# Patient Record
Sex: Male | Born: 1970 | Race: Black or African American | Hispanic: No | Smoking: Former smoker
Health system: Southern US, Community
[De-identification: ages and names within clinical notes are randomized; demographics above are authoritative.]

## PROBLEM LIST (undated history)

## (undated) DIAGNOSIS — J40 Bronchitis, not specified as acute or chronic: Secondary | ICD-10-CM

## (undated) DIAGNOSIS — M2040 Other hammer toe(s) (acquired), unspecified foot: Secondary | ICD-10-CM

## (undated) DIAGNOSIS — J309 Allergic rhinitis, unspecified: Secondary | ICD-10-CM

## (undated) DIAGNOSIS — N529 Male erectile dysfunction, unspecified: Secondary | ICD-10-CM

## (undated) DIAGNOSIS — T8859XA Other complications of anesthesia, initial encounter: Secondary | ICD-10-CM

## (undated) DIAGNOSIS — M5136 Other intervertebral disc degeneration, lumbar region: Secondary | ICD-10-CM

## (undated) DIAGNOSIS — F319 Bipolar disorder, unspecified: Secondary | ICD-10-CM

## (undated) DIAGNOSIS — M51369 Other intervertebral disc degeneration, lumbar region without mention of lumbar back pain or lower extremity pain: Secondary | ICD-10-CM

## (undated) DIAGNOSIS — S129XXA Fracture of neck, unspecified, initial encounter: Secondary | ICD-10-CM

## (undated) DIAGNOSIS — E559 Vitamin D deficiency, unspecified: Secondary | ICD-10-CM

## (undated) DIAGNOSIS — F1210 Cannabis abuse, uncomplicated: Secondary | ICD-10-CM

## (undated) DIAGNOSIS — T4145XA Adverse effect of unspecified anesthetic, initial encounter: Secondary | ICD-10-CM

## (undated) HISTORY — DX: Other intervertebral disc degeneration, lumbar region: M51.36

## (undated) HISTORY — PX: TOE SURGERY: SHX1073

## (undated) HISTORY — PX: OTHER SURGICAL HISTORY: SHX169

## (undated) HISTORY — DX: Other hammer toe(s) (acquired), unspecified foot: M20.40

## (undated) HISTORY — PX: NECK SURGERY: SHX720

## (undated) HISTORY — DX: Male erectile dysfunction, unspecified: N52.9

## (undated) HISTORY — DX: Allergic rhinitis, unspecified: J30.9

## (undated) HISTORY — PX: HERNIA REPAIR: SHX51

## (undated) HISTORY — DX: Bronchitis, not specified as acute or chronic: J40

## (undated) HISTORY — DX: Other intervertebral disc degeneration, lumbar region without mention of lumbar back pain or lower extremity pain: M51.369

## (undated) HISTORY — DX: Vitamin D deficiency, unspecified: E55.9

---

## 2011-07-10 ENCOUNTER — Emergency Department (HOSPITAL_COMMUNITY)
Admission: EM | Admit: 2011-07-10 | Discharge: 2011-07-10 | Disposition: A | Discharge status: Home / Self Care | Payer: Self-pay | Attending: Emergency Medicine | Admitting: Emergency Medicine

## 2011-07-10 DIAGNOSIS — K2990 Gastroduodenitis, unspecified, without bleeding: Secondary | ICD-10-CM | POA: Insufficient documentation

## 2011-07-10 DIAGNOSIS — R630 Anorexia: Secondary | ICD-10-CM | POA: Insufficient documentation

## 2011-07-10 DIAGNOSIS — R51 Headache: Secondary | ICD-10-CM | POA: Insufficient documentation

## 2011-07-10 DIAGNOSIS — R1033 Periumbilical pain: Secondary | ICD-10-CM | POA: Insufficient documentation

## 2011-07-10 DIAGNOSIS — R110 Nausea: Secondary | ICD-10-CM | POA: Insufficient documentation

## 2011-07-10 DIAGNOSIS — K2970 Gastritis, unspecified, without bleeding: Secondary | ICD-10-CM | POA: Insufficient documentation

## 2011-07-10 DIAGNOSIS — Z9889 Other specified postprocedural states: Secondary | ICD-10-CM | POA: Insufficient documentation

## 2011-07-10 LAB — DIFFERENTIAL
Lymphocytes Relative: 46 % (ref 12–46)
Lymphs Abs: 2.1 10*3/uL (ref 0.7–4.0)
Monocytes Absolute: 0.4 10*3/uL (ref 0.1–1.0)
Monocytes Relative: 9 % (ref 3–12)
Neutro Abs: 1.9 10*3/uL (ref 1.7–7.7)

## 2011-07-10 LAB — COMPREHENSIVE METABOLIC PANEL
Alkaline Phosphatase: 63 U/L (ref 39–117)
BUN: 9 mg/dL (ref 6–23)
CO2: 24 mEq/L (ref 19–32)
Calcium: 9.3 mg/dL (ref 8.4–10.5)
GFR calc Af Amer: >60 mL/min (ref >60)
GFR calc non Af Amer: >60 mL/min (ref >60)
Glucose, Bld: 78 mg/dL (ref 70–99)
Total Protein: 6.8 g/dL (ref 6.0–8.3)

## 2011-07-10 LAB — LIPASE, BLOOD: Lipase: 38 U/L (ref 11–59)

## 2011-07-10 LAB — CBC
HCT: 41.9 % (ref 39.0–52.0)
Hemoglobin: 14.8 g/dL (ref 13.0–17.0)
MCH: 33.3 pg (ref 26.0–34.0)
MCHC: 35.3 g/dL (ref 30.0–36.0)
RBC: 4.45 MIL/uL (ref 4.22–5.81)

## 2012-09-22 ENCOUNTER — Emergency Department (HOSPITAL_COMMUNITY): Payer: BC Managed Care – PPO

## 2012-09-22 ENCOUNTER — Emergency Department (HOSPITAL_COMMUNITY)
Admission: EM | Admit: 2012-09-22 | Discharge: 2012-09-22 | Disposition: A | Discharge status: Home / Self Care | Payer: BC Managed Care – PPO | Attending: Emergency Medicine | Admitting: Emergency Medicine

## 2012-09-22 ENCOUNTER — Encounter (HOSPITAL_COMMUNITY): Payer: Self-pay | Admitting: *Deleted

## 2012-09-22 DIAGNOSIS — Z91012 Allergy to eggs: Secondary | ICD-10-CM | POA: Insufficient documentation

## 2012-09-22 DIAGNOSIS — M542 Cervicalgia: Secondary | ICD-10-CM | POA: Insufficient documentation

## 2012-09-22 MED ORDER — DIAZEPAM 5 MG PO TABS
5.0000 mg | ORAL_TABLET | Freq: Once | ORAL | Status: AC
  Administered 2012-09-22: 5 mg via ORAL
  Filled 2012-09-22: qty 1

## 2012-09-22 MED ORDER — IBUPROFEN 800 MG PO TABS: 800.0000 mg | ORAL_TABLET | Freq: Three times a day (TID) | ORAL | Status: DC

## 2012-09-22 MED ORDER — CYCLOBENZAPRINE HCL 10 MG PO TABS: 10.0000 mg | ORAL_TABLET | Freq: Two times a day (BID) | ORAL | Status: DC | PRN

## 2012-09-22 MED ORDER — OXYCODONE-ACETAMINOPHEN 5-325 MG PO TABS: 1.0000 | ORAL_TABLET | ORAL | Status: DC | PRN

## 2012-09-22 MED ORDER — OXYCODONE-ACETAMINOPHEN 5-325 MG PO TABS
2.0000 | ORAL_TABLET | Freq: Once | ORAL | Status: AC
  Administered 2012-09-22: 2 via ORAL
  Filled 2012-09-22: qty 2

## 2012-09-22 MED ORDER — IBUPROFEN 800 MG PO TABS
800.0000 mg | ORAL_TABLET | Freq: Once | ORAL | Status: AC
  Administered 2012-09-22: 800 mg via ORAL
  Filled 2012-09-22: qty 1
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
## 2013-04-14 ENCOUNTER — Encounter (HOSPITAL_COMMUNITY): Payer: Self-pay | Admitting: *Deleted

## 2013-04-14 ENCOUNTER — Emergency Department (HOSPITAL_COMMUNITY)
Admission: EM | Admit: 2013-04-14 | Discharge: 2013-04-14 | Disposition: A | Discharge status: Home / Self Care | Payer: Medicaid Other | Attending: Emergency Medicine | Admitting: Emergency Medicine

## 2013-04-14 DIAGNOSIS — M62838 Other muscle spasm: Secondary | ICD-10-CM | POA: Insufficient documentation

## 2013-04-14 MED ORDER — IBUPROFEN 400 MG PO TABS
800.0000 mg | ORAL_TABLET | Freq: Once | ORAL | Status: AC
  Administered 2013-04-14: 800 mg via ORAL
  Filled 2013-04-14: qty 2

## 2013-04-14 MED ORDER — IBUPROFEN 800 MG PO TABS: 800.0000 mg | ORAL_TABLET | Freq: Once | ORAL | Status: DC

## 2013-04-14 MED ORDER — DIAZEPAM 5 MG PO TABS: 5.0000 mg | ORAL_TABLET | Freq: Two times a day (BID) | ORAL | Status: DC | PRN

## 2013-04-14 MED ORDER — DIAZEPAM 5 MG PO TABS
5.0000 mg | ORAL_TABLET | Freq: Once | ORAL | Status: AC
  Administered 2013-04-14: 5 mg via ORAL
  Filled 2013-04-14: qty 1

## 2013-04-14 NOTE — ED Provider Notes (Signed)
Medical screening examination/treatment/procedure(s) were performed by non-physician practitioner and as supervising physician I was immediately available for consultation/collaboration.   Charles B. Sheldon, MD 04/14/13 2339 
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
## 2013-04-14 NOTE — ED Provider Notes (Signed)
History    This chart was scribed for non-physician practitioner Dorthula Matas, PA-C working with Bonnita Levan. Bernette Mayers, MD by Gerlean Ren, ED Scribe. This patient was seen in room TR11C/TR11C and the patient's care was started at 9:00 PM.    CSN: [id number]  Arrival date & time 04/14/13  1958   First MD Initiated Contact with Patient 04/14/13 2044      Chief Complaint  Patient presents with  . Neck Pain     The history is provided by the patient. No language interpreter was used.  Clinton Allen is a 42 y.o. male who presents to the Emergency Department complaining of left side neck pain that has been chronic since neck surgery 4-5 years ago that has been noticeably worse today.  Pt denies any recent unusual activities today or traumas.  Pt denies HA, nausea, emesis, changes in vision.  Pt reports he had pain management physician previously in [place] but has not contacted one since coming to the area.     History reviewed. No pertinent past medical history.  Past Surgical History  Procedure Laterality Date  . Neck surgery    . Toe surgery      History reviewed. No pertinent family history.  History  Substance Use Topics  . Smoking status: Never Smoker   . Smokeless tobacco: Not on file  . Alcohol Use: Yes      Review of Systems  Constitutional: Negative for fever and chills.  HENT: Positive for neck pain (chronic, flare up).   Eyes: Negative for visual disturbance.  Respiratory: Negative for shortness of breath.   Gastrointestinal: Negative for nausea and vomiting.  Neurological: Negative for weakness and headaches.  All other systems reviewed and are negative.    Allergies  Eggs or egg-derived products  Home Medications   Current Outpatient Rx  Name  Route  Sig  Dispense  Refill  . cyclobenzaprine (FLEXERIL) 10 MG tablet   Oral   Take 10 mg by mouth 2 (two) times daily as needed for muscle spasms.         . diazepam (VALIUM) 5 MG tablet   Oral  Take 1 tablet (5 mg total) by mouth every 12 (twelve) hours as needed for anxiety.   10 tablet   0   . ibuprofen (ADVIL,MOTRIN) 800 MG tablet   Oral   Take 1 tablet (800 mg total) by mouth once.   30 tablet   0     BP 122/81  Pulse 68  Temp(Src) 98.5 F (36.9 C) (Oral)  Resp 14  SpO2 100%  Physical Exam  Nursing note and vitals reviewed. Constitutional: He is oriented to person, place, and time. He appears well-developed and well-nourished. No distress.  HENT:  Head: Normocephalic and atraumatic.  Eyes: EOM are normal.  Neck: Neck supple. Muscular tenderness present. No spinous process tenderness present. No rigidity. No tracheal deviation and normal range of motion present. No Brudzinski's sign and no Kernig's sign noted.    Cardiovascular: Normal rate.   Pulmonary/Chest: Effort normal. No respiratory distress.  Musculoskeletal: Normal range of motion.  Neurological: He is alert and oriented to person, place, and time.  Skin: Skin is warm and dry.  Psychiatric: He has a normal mood and affect. His behavior is normal.    ED Course  Procedures (including critical care time) DIAGNOSTIC STUDIES: Oxygen Saturation is 100% on room air, normal by my interpretation.    COORDINATION OF CARE: 9:03 PM- Discussed short term pain  medicine prescription.  Informed pt I will provide him with contact information for pain management physician and advised pt to contact.  Pt verbalizes understanding.     1. Neck muscle spasm       MDM  Pt has been advised of the symptoms that warrant their return to the ED. Patient has voiced understanding and has agreed to follow-up with the PCP or specialist.  I personally performed the services described in this documentation, which was scribed in my presence. The recorded information has been reviewed and is accurate.    Dorthula Matas, PA-C 04/14/13 2131

## 2013-04-15 ENCOUNTER — Telehealth (HOSPITAL_COMMUNITY): Payer: Self-pay | Admitting: Emergency Medicine

## 2013-04-15 NOTE — ED Notes (Signed)
Needed clarification of motrin order.

## 2013-06-24 ENCOUNTER — Encounter (HOSPITAL_COMMUNITY): Payer: Self-pay | Admitting: Emergency Medicine

## 2013-06-24 ENCOUNTER — Emergency Department (HOSPITAL_COMMUNITY)
Admission: EM | Admit: 2013-06-24 | Discharge: 2013-06-24 | Disposition: A | Discharge status: Home / Self Care | Payer: Medicaid Other | Attending: Emergency Medicine | Admitting: Emergency Medicine

## 2013-06-24 DIAGNOSIS — Z9889 Other specified postprocedural states: Secondary | ICD-10-CM | POA: Insufficient documentation

## 2013-06-24 DIAGNOSIS — Z79899 Other long term (current) drug therapy: Secondary | ICD-10-CM | POA: Insufficient documentation

## 2013-06-24 DIAGNOSIS — M79609 Pain in unspecified limb: Secondary | ICD-10-CM | POA: Insufficient documentation

## 2013-06-24 DIAGNOSIS — G8929 Other chronic pain: Secondary | ICD-10-CM

## 2013-06-24 DIAGNOSIS — M79673 Pain in unspecified foot: Secondary | ICD-10-CM

## 2013-06-24 DIAGNOSIS — M542 Cervicalgia: Secondary | ICD-10-CM | POA: Insufficient documentation

## 2013-06-24 MED ORDER — TRAMADOL HCL 50 MG PO TABS: 50.0000 mg | ORAL_TABLET | Freq: Four times a day (QID) | ORAL | Status: DC | PRN

## 2013-06-24 MED ORDER — CYCLOBENZAPRINE HCL 10 MG PO TABS: 10.0000 mg | ORAL_TABLET | Freq: Two times a day (BID) | ORAL | Status: DC | PRN

## 2013-06-24 MED ORDER — KETOROLAC TROMETHAMINE 60 MG/2ML IM SOLN
60.0000 mg | Freq: Once | INTRAMUSCULAR | Status: AC
  Administered 2013-06-24: 60 mg via INTRAMUSCULAR
  Filled 2013-06-24: qty 2

## 2013-06-24 MED ORDER — DIAZEPAM 5 MG PO TABS
5.0000 mg | ORAL_TABLET | Freq: Once | ORAL | Status: AC
  Administered 2013-06-24: 5 mg via ORAL
  Filled 2013-06-24: qty 1
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
## 2013-06-24 NOTE — ED Provider Notes (Signed)
Medical screening examination/treatment/procedure(s) were performed by non-physician practitioner and as supervising physician I was immediately available for consultation/collaboration.   Sharelle Burditt B. Carson Meche, MD 06/24/13 1104 

## 2013-06-24 NOTE — ED Provider Notes (Signed)
   History    CSN: [id number] Arrival date & time 06/24/13  0912  First MD Initiated Contact with Patient 06/24/13 [phone]     Chief Complaint  Patient presents with  . Neck Pain   (Consider location/radiation/quality/duration/timing/severity/associated sxs/prior Treatment) HPI Comments: Patient is a 42 year old male who presents with chronic neck and foot pain "for years." The pain became acutely worse last night and patient did not have any pain medication to take. The pain is aching and and severe located in cervical spine and bilateral feet. Movement makes the pain worse. Nothing makes the pain better. No other associated symptoms. Patient denies injury.   History reviewed. No pertinent past medical history. Past Surgical History  Procedure Laterality Date  . Neck surgery    . Toe surgery     No family history on file. History  Substance Use Topics  . Smoking status: Never Smoker   . Smokeless tobacco: Not on file  . Alcohol Use: Yes    Review of Systems  HENT: Positive for neck pain.   All other systems reviewed and are negative.    Allergies  Eggs or egg-derived products  Home Medications   Current Outpatient Rx  Name  Route  Sig  Dispense  Refill  . QUEtiapine (SEROQUEL XR) 400 MG 24 hr tablet   Oral   Take 800 mg by mouth at bedtime.          BP 114/77  Pulse 71  Temp(Src) 97.4 F (36.3 C) (Oral)  Resp 18  SpO2 100% Physical Exam  Nursing note and vitals reviewed. Constitutional: He is oriented to person, place, and time. He appears well-developed and well-nourished. No distress.  HENT:  Head: Normocephalic and atraumatic.  Eyes: Conjunctivae and EOM are normal.  Neck:  ROM limited due to pain. No obvious deformity.   Cardiovascular: Normal rate and regular rhythm.  Exam reveals no gallop and no friction rub.   No murmur heard. Pulmonary/Chest: Effort normal and breath sounds normal. He has no wheezes. He has no rales. He exhibits no tenderness.   Abdominal: Soft. There is no tenderness.  Musculoskeletal: Normal range of motion.  No obvious deformity of toes. Pain with movement.   Neurological: He is alert and oriented to person, place, and time. Coordination normal.  Extremity strength and sensation equal and intact bilaterally. Speech is goal-oriented. Moves limbs without ataxia.   Skin: Skin is warm and dry.  Psychiatric: He has a normal mood and affect. His behavior is normal.    ED Course  Procedures (including critical care time) Labs Reviewed - No data to display No results found.  1. Chronic neck pain   2. Chronic foot pain, unspecified laterality     MDM  9:55 AM Patient will have IM toradol and valium. Patient has chronic pain and denies new injury. Patient will follow up with PCP for further evaluation.   Emilia Beck, PA-C 06/24/13 1001

## 2013-06-30 DIAGNOSIS — F319 Bipolar disorder, unspecified: Secondary | ICD-10-CM | POA: Insufficient documentation

## 2013-06-30 DIAGNOSIS — Z9889 Other specified postprocedural states: Secondary | ICD-10-CM | POA: Insufficient documentation

## 2013-06-30 DIAGNOSIS — Q742 Other congenital malformations of lower limb(s), including pelvic girdle: Secondary | ICD-10-CM | POA: Insufficient documentation

## 2013-06-30 DIAGNOSIS — F1210 Cannabis abuse, uncomplicated: Secondary | ICD-10-CM | POA: Insufficient documentation

## 2013-11-12 ENCOUNTER — Emergency Department (HOSPITAL_COMMUNITY): Payer: No Typology Code available for payment source

## 2013-11-12 ENCOUNTER — Encounter (HOSPITAL_COMMUNITY): Payer: Self-pay | Admitting: Emergency Medicine

## 2013-11-12 ENCOUNTER — Emergency Department (HOSPITAL_COMMUNITY)
Admission: EM | Admit: 2013-11-12 | Discharge: 2013-11-12 | Disposition: A | Discharge status: Home / Self Care | Payer: No Typology Code available for payment source | Attending: Emergency Medicine | Admitting: Emergency Medicine

## 2013-11-12 DIAGNOSIS — S139XXA Sprain of joints and ligaments of unspecified parts of neck, initial encounter: Secondary | ICD-10-CM | POA: Insufficient documentation

## 2013-11-12 DIAGNOSIS — Y9389 Activity, other specified: Secondary | ICD-10-CM | POA: Insufficient documentation

## 2013-11-12 DIAGNOSIS — IMO0002 Reserved for concepts with insufficient information to code with codable children: Secondary | ICD-10-CM | POA: Insufficient documentation

## 2013-11-12 DIAGNOSIS — Z9889 Other specified postprocedural states: Secondary | ICD-10-CM | POA: Insufficient documentation

## 2013-11-12 DIAGNOSIS — S62609A Fracture of unspecified phalanx of unspecified finger, initial encounter for closed fracture: Secondary | ICD-10-CM

## 2013-11-12 DIAGNOSIS — Y9289 Other specified places as the place of occurrence of the external cause: Secondary | ICD-10-CM | POA: Insufficient documentation

## 2013-11-12 DIAGNOSIS — S161XXA Strain of muscle, fascia and tendon at neck level, initial encounter: Secondary | ICD-10-CM

## 2013-11-12 NOTE — ED Notes (Addendum)
Pt returns from radiology. 
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
## 2013-11-12 NOTE — ED Notes (Signed)
Patient transported to X-ray 

## 2013-11-12 NOTE — ED Notes (Signed)
MD at bedside. 
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
## 2014-01-02 DIAGNOSIS — M79673 Pain in unspecified foot: Secondary | ICD-10-CM | POA: Insufficient documentation

## 2014-01-02 DIAGNOSIS — G894 Chronic pain syndrome: Secondary | ICD-10-CM | POA: Insufficient documentation

## 2014-06-15 ENCOUNTER — Encounter (HOSPITAL_COMMUNITY): Payer: Self-pay | Admitting: Emergency Medicine

## 2014-06-15 ENCOUNTER — Emergency Department (HOSPITAL_COMMUNITY)
Admission: EM | Admit: 2014-06-15 | Discharge: 2014-06-16 | Disposition: A | Discharge status: Home / Self Care | Payer: Medicaid Other | Attending: Emergency Medicine | Admitting: Emergency Medicine

## 2014-06-15 DIAGNOSIS — H5789 Other specified disorders of eye and adnexa: Secondary | ICD-10-CM | POA: Insufficient documentation

## 2014-06-15 DIAGNOSIS — Z79899 Other long term (current) drug therapy: Secondary | ICD-10-CM | POA: Insufficient documentation

## 2014-06-15 DIAGNOSIS — G8929 Other chronic pain: Secondary | ICD-10-CM

## 2014-06-15 DIAGNOSIS — H109 Unspecified conjunctivitis: Secondary | ICD-10-CM

## 2014-06-15 DIAGNOSIS — H579 Unspecified disorder of eye and adnexa: Secondary | ICD-10-CM | POA: Insufficient documentation

## 2014-06-15 DIAGNOSIS — H1030 Unspecified acute conjunctivitis, unspecified eye: Secondary | ICD-10-CM | POA: Insufficient documentation

## 2014-06-15 DIAGNOSIS — M542 Cervicalgia: Secondary | ICD-10-CM

## 2014-06-15 MED ORDER — TETRACAINE HCL 0.5 % OP SOLN
1.0000 [drp] | Freq: Once | OPHTHALMIC | Status: AC
  Administered 2014-06-15: 1 [drp] via OPHTHALMIC
  Filled 2014-06-15: qty 2

## 2014-06-15 MED ORDER — FLUORESCEIN SODIUM 1 MG OP STRP
2.0000 | ORAL_STRIP | Freq: Once | OPHTHALMIC | Status: AC
  Administered 2014-06-15: 2 via OPHTHALMIC
  Filled 2014-06-15: qty 2
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
## 2014-06-15 NOTE — ED Notes (Signed)
Visual acquity rt eye 20/30 lt eye 20/35
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
## 2014-06-16 MED ORDER — CYCLOBENZAPRINE HCL 10 MG PO TABS
10.0000 mg | ORAL_TABLET | Freq: Once | ORAL | Status: AC
  Administered 2014-06-16: 10 mg via ORAL
  Filled 2014-06-16: qty 1

## 2014-06-16 MED ORDER — KETOROLAC TROMETHAMINE 60 MG/2ML IM SOLN
60.0000 mg | Freq: Once | INTRAMUSCULAR | Status: AC
  Administered 2014-06-16: 60 mg via INTRAMUSCULAR
  Filled 2014-06-16: qty 2

## 2014-06-16 MED ORDER — CYCLOBENZAPRINE HCL 10 MG PO TABS: 10.0000 mg | ORAL_TABLET | Freq: Two times a day (BID) | ORAL | Status: DC | PRN

## 2014-06-16 MED ORDER — NAPHAZOLINE-PHENIRAMINE 0.025-0.3 % OP SOLN
1.0000 [drp] | Freq: Four times a day (QID) | OPHTHALMIC | Status: DC | PRN
  Administered 2014-06-16: 1 [drp] via OPHTHALMIC
  Filled 2014-06-16: qty 15
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
## 2014-06-16 NOTE — Discharge Instructions (Signed)

## 2014-06-18 NOTE — ED Provider Notes (Signed)
Medical screening examination/treatment/procedure(s) were performed by non-physician practitioner and as supervising physician I was immediately available for consultation/collaboration.   EKG Interpretation None        Christopher J. Pollina, MD 06/18/14 0815 

## 2014-06-30 ENCOUNTER — Encounter (HOSPITAL_COMMUNITY): Payer: Self-pay | Admitting: Emergency Medicine

## 2014-06-30 ENCOUNTER — Emergency Department (HOSPITAL_COMMUNITY)
Admission: EM | Admit: 2014-06-30 | Discharge: 2014-06-30 | Disposition: A | Discharge status: Home / Self Care | Payer: Medicaid Other | Attending: Emergency Medicine | Admitting: Emergency Medicine

## 2014-06-30 DIAGNOSIS — Z79899 Other long term (current) drug therapy: Secondary | ICD-10-CM | POA: Diagnosis not present

## 2014-06-30 DIAGNOSIS — M542 Cervicalgia: Secondary | ICD-10-CM | POA: Diagnosis not present

## 2014-06-30 DIAGNOSIS — J069 Acute upper respiratory infection, unspecified: Secondary | ICD-10-CM | POA: Diagnosis not present

## 2014-06-30 DIAGNOSIS — J029 Acute pharyngitis, unspecified: Secondary | ICD-10-CM | POA: Diagnosis present

## 2014-06-30 DIAGNOSIS — Z8781 Personal history of (healed) traumatic fracture: Secondary | ICD-10-CM | POA: Insufficient documentation

## 2014-06-30 HISTORY — DX: Fracture of neck, unspecified, initial encounter: S12.9XXA

## 2014-06-30 LAB — RAPID STREP SCREEN (MED CTR MEBANE ONLY): STREPTOCOCCUS, GROUP A SCREEN (DIRECT): NEGATIVE

## 2014-06-30 MED ORDER — ACETAMINOPHEN-CODEINE 120-12 MG/5ML PO SOLN
10.0000 mL | Freq: Once | ORAL | Status: AC
Start: 2014-06-30 — End: 2014-06-30
  Administered 2014-06-30: 10 mL via ORAL

## 2014-06-30 MED ORDER — GUAIFENESIN ER 1200 MG PO TB12: 1.0000 | ORAL_TABLET | Freq: Two times a day (BID) | ORAL | Status: DC

## 2014-06-30 MED ORDER — ACETAMINOPHEN-CODEINE 120-12 MG/5ML PO SOLN: 10.0000 mL | ORAL | Status: DC | PRN

## 2014-06-30 MED ORDER — PREDNISONE 50 MG PO TABS: 50.0000 mg | ORAL_TABLET | Freq: Every day | ORAL | Status: DC

## 2014-06-30 MED ORDER — AZITHROMYCIN 250 MG PO TABS
ORAL_TABLET | ORAL | Status: DC
Start: 2014-06-30 — End: 2015-01-31
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
## 2014-06-30 NOTE — Discharge Instructions (Signed)
Return here as needed.  Increase her fluid intake, rest as much as possible.  Followup with your primary care

## 2014-06-30 NOTE — ED Notes (Signed)
Pt c/o sore throat x 4 days and left lateral neck pain when he tries to look left. Pt sts he has tried OTC pain meds but no relief. Denies fever at home. Nad, skin warm and dry, resp e/u.

## 2014-07-01 NOTE — ED Provider Notes (Signed)
Medical screening examination/treatment/procedure(s) were performed by non-physician practitioner and as supervising physician I was immediately available for consultation/collaboration.   EKG Interpretation None        Shaterrica Territo F Merrillyn Ackerley, MD 07/01/14 0830 

## 2014-07-02 LAB — CULTURE, GROUP A STREP

## 2014-07-07 ENCOUNTER — Encounter (HOSPITAL_COMMUNITY): Payer: Self-pay | Admitting: Emergency Medicine

## 2014-07-07 ENCOUNTER — Emergency Department (HOSPITAL_COMMUNITY)
Admission: EM | Admit: 2014-07-07 | Discharge: 2014-07-07 | Disposition: A | Discharge status: Home / Self Care | Payer: Medicaid Other | Attending: Emergency Medicine | Admitting: Emergency Medicine

## 2014-07-07 DIAGNOSIS — W64XXXA Exposure to other animate mechanical forces, initial encounter: Secondary | ICD-10-CM | POA: Diagnosis not present

## 2014-07-07 DIAGNOSIS — R296 Repeated falls: Secondary | ICD-10-CM | POA: Diagnosis not present

## 2014-07-07 DIAGNOSIS — Y9289 Other specified places as the place of occurrence of the external cause: Secondary | ICD-10-CM | POA: Insufficient documentation

## 2014-07-07 DIAGNOSIS — S99919A Unspecified injury of unspecified ankle, initial encounter: Secondary | ICD-10-CM | POA: Diagnosis present

## 2014-07-07 DIAGNOSIS — Z79899 Other long term (current) drug therapy: Secondary | ICD-10-CM | POA: Diagnosis not present

## 2014-07-07 DIAGNOSIS — S99929A Unspecified injury of unspecified foot, initial encounter: Principal | ICD-10-CM

## 2014-07-07 DIAGNOSIS — G8911 Acute pain due to trauma: Secondary | ICD-10-CM | POA: Diagnosis not present

## 2014-07-07 DIAGNOSIS — Z792 Long term (current) use of antibiotics: Secondary | ICD-10-CM | POA: Insufficient documentation

## 2014-07-07 DIAGNOSIS — S8990XA Unspecified injury of unspecified lower leg, initial encounter: Secondary | ICD-10-CM | POA: Insufficient documentation

## 2014-07-07 DIAGNOSIS — Y9389 Activity, other specified: Secondary | ICD-10-CM | POA: Diagnosis not present

## 2014-07-07 DIAGNOSIS — Z8781 Personal history of (healed) traumatic fracture: Secondary | ICD-10-CM | POA: Diagnosis not present

## 2014-07-07 DIAGNOSIS — M79662 Pain in left lower leg: Secondary | ICD-10-CM

## 2014-07-07 MED ORDER — IBUPROFEN 800 MG PO TABS: 800.0000 mg | ORAL_TABLET | Freq: Three times a day (TID) | ORAL | Status: DC

## 2014-07-07 MED ORDER — OXYCODONE-ACETAMINOPHEN 5-325 MG PO TABS
1.0000 | ORAL_TABLET | Freq: Once | ORAL | Status: AC
  Administered 2014-07-07: 1 via ORAL
  Filled 2014-07-07: qty 1
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
## 2014-07-07 NOTE — ED Provider Notes (Signed)
Medical screening examination/treatment/procedure(s) were performed by non-physician practitioner and as supervising physician I was immediately available for consultation/collaboration.   EKG Interpretation None        Conn Trombetta, MD 07/07/14 1852 

## 2014-07-07 NOTE — ED Notes (Addendum)
Had surgery July 7th on left foot, pt comes in wearing boot and is on crutches and today dogs ran over his foot and knocked him down huting left leg. pt has young child with him 43 year old has no one else with him

## 2014-07-07 NOTE — Discharge Instructions (Signed)
Please follow up closely with your foot doctor for further management of your leg injury.  Meanwhile, follow instruction below for care.    RICE: Routine Care for Injuries The routine care of many injuries includes Rest, Ice, Compression, and Elevation (RICE). HOME CARE INSTRUCTIONS  Rest is needed to allow your body to heal. Routine activities can usually be resumed when comfortable. Injured tendons and bones can take up to 6 weeks to heal. Tendons are the cord-like structures that attach muscle to bone.  Ice following an injury helps keep the swelling down and reduces pain.  Put ice in a plastic bag.  Place a towel between your skin and the bag.  Leave the ice on for 15-20 minutes, 3-4 times a day, or as directed by your health care provider. Do this while awake, for the first 24 to 48 hours. After that, continue as directed by your caregiver.  Compression helps keep swelling down. It also gives support and helps with discomfort. If an elastic bandage has been applied, it should be removed and reapplied every 3 to 4 hours. It should not be applied tightly, but firmly enough to keep swelling down. Watch fingers or toes for swelling, bluish discoloration, coldness, numbness, or excessive pain. If any of these problems occur, remove the bandage and reapply loosely. Contact your caregiver if these problems continue.  Elevation helps reduce swelling and decreases pain. With extremities, such as the arms, hands, legs, and feet, the injured area should be placed near or above the level of the heart, if possible. SEEK IMMEDIATE MEDICAL CARE IF:  You have persistent pain and swelling.  You develop redness, numbness, or unexpected weakness.  Your symptoms are getting worse rather than improving after several days. These symptoms may indicate that further evaluation or further X-rays are needed. Sometimes, X-rays may not show a small broken bone (fracture) until 1 week or 10 days later. Make a  follow-up appointment with your caregiver. Ask when your X-ray results will be ready. Make sure you get your X-ray results. Document Released: 03/16/2001 Document Revised: 12/07/2013 Document Reviewed: 05/03/2011 [facility] Patient Information 2015 [place]. This information is not intended to replace advice given to you by your health care provider. Make sure you discuss any questions you have with your health care provider.

## 2014-09-05 ENCOUNTER — Encounter (HOSPITAL_COMMUNITY): Payer: Self-pay | Admitting: Emergency Medicine

## 2014-09-05 ENCOUNTER — Emergency Department (HOSPITAL_COMMUNITY): Payer: Medicaid Other

## 2014-09-05 ENCOUNTER — Emergency Department (HOSPITAL_COMMUNITY)
Admission: EM | Admit: 2014-09-05 | Discharge: 2014-09-05 | Disposition: A | Discharge status: Home / Self Care | Payer: Medicaid Other | Attending: Emergency Medicine | Admitting: Emergency Medicine

## 2014-09-05 DIAGNOSIS — Z792 Long term (current) use of antibiotics: Secondary | ICD-10-CM | POA: Diagnosis not present

## 2014-09-05 DIAGNOSIS — J209 Acute bronchitis, unspecified: Secondary | ICD-10-CM | POA: Diagnosis not present

## 2014-09-05 DIAGNOSIS — M542 Cervicalgia: Secondary | ICD-10-CM | POA: Diagnosis not present

## 2014-09-05 DIAGNOSIS — J40 Bronchitis, not specified as acute or chronic: Secondary | ICD-10-CM

## 2014-09-05 DIAGNOSIS — Z8781 Personal history of (healed) traumatic fracture: Secondary | ICD-10-CM | POA: Diagnosis not present

## 2014-09-05 DIAGNOSIS — Z79899 Other long term (current) drug therapy: Secondary | ICD-10-CM | POA: Insufficient documentation

## 2014-09-05 DIAGNOSIS — J069 Acute upper respiratory infection, unspecified: Secondary | ICD-10-CM | POA: Insufficient documentation

## 2014-09-05 DIAGNOSIS — Z87891 Personal history of nicotine dependence: Secondary | ICD-10-CM | POA: Insufficient documentation

## 2014-09-05 DIAGNOSIS — IMO0002 Reserved for concepts with insufficient information to code with codable children: Secondary | ICD-10-CM | POA: Insufficient documentation

## 2014-09-05 MED ORDER — ALBUTEROL SULFATE HFA 108 (90 BASE) MCG/ACT IN AERS: 2.0000 | INHALATION_SPRAY | RESPIRATORY_TRACT | Status: DC | PRN

## 2014-09-05 MED ORDER — HYDROCOD POLST-CHLORPHEN POLST 10-8 MG/5ML PO LQCR
5.0000 mL | Freq: Once | ORAL | Status: AC
  Administered 2014-09-05: 5 mL via ORAL
  Filled 2014-09-05: qty 5

## 2014-09-05 MED ORDER — PREDNISONE 50 MG PO TABS
ORAL_TABLET | ORAL | Status: DC
Start: 2014-09-05 — End: 2015-01-31

## 2014-09-05 MED ORDER — IPRATROPIUM-ALBUTEROL 0.5-2.5 (3) MG/3ML IN SOLN
3.0000 mL | Freq: Once | RESPIRATORY_TRACT | Status: AC
  Administered 2014-09-05: 3 mL via RESPIRATORY_TRACT
  Filled 2014-09-05: qty 3

## 2014-09-05 MED ORDER — PREDNISONE 20 MG PO TABS
60.0000 mg | ORAL_TABLET | Freq: Once | ORAL | Status: AC
  Administered 2014-09-05: 60 mg via ORAL
  Filled 2014-09-05: qty 3

## 2014-09-05 MED ORDER — AZITHROMYCIN 250 MG PO TABS
ORAL_TABLET | ORAL | Status: DC
Start: 2014-09-05 — End: 2015-01-31

## 2014-09-05 MED ORDER — HYDROCODONE-ACETAMINOPHEN 7.5-325 MG/15ML PO SOLN: 15.0000 mL | Freq: Four times a day (QID) | ORAL | Status: DC | PRN

## 2014-09-05 NOTE — ED Notes (Signed)
Pt c/o neck pain x months and URI sx with cough and congestion x several days

## 2014-09-05 NOTE — Discharge Instructions (Signed)
°Take your antibiotics as directed and to completion. You should never have any leftover antibiotics! Push fluids and stay well hydrated.  ° °Do not hesitate to return to the emergency room for any new, worsening or concerning symptoms. ° °Please obtain primary care using resource guide below. But the minute you were seen in the emergency room and that they will need to obtain records for further outpatient management. ° ° °Emergency Department Resource Guide °1) Find a Doctor and Pay Out of Pocket °Although you won't have to find out who is covered by your insurance plan, it is a good idea to ask around and get recommendations. You will then need to call the office and see if the doctor you have chosen will accept you as a new patient and what types of options they offer for patients who are self-pay. Some doctors offer discounts or will set up payment plans for their patients who do not have insurance, but you will need to ask so you aren't surprised when you get to your appointment. ° °2) Contact Your Local Health Department °Not all health departments have doctors that can see patients for sick visits, but many do, so it is worth a call to see if yours does. If you don't know where your local health department is, you can check in your phone book. The CDC also has a tool to help you locate your state's health department, and many state websites also have listings of all of their local health departments. ° °3) Find a Walk-in Clinic °If your illness is not likely to be very severe or complicated, you may want to try a walk in clinic. These are popping up all over the country in pharmacies, drugstores, and shopping centers. They're usually staffed by nurse practitioners or physician assistants that have been trained to treat common illnesses and complaints. They're usually fairly quick and inexpensive. However, if you have serious medical issues or chronic medical problems, these are probably not your best  option. ° °No Primary Care Doctor: °- Call [facility] at  832-8000 - they can help you locate a primary care doctor that  accepts your insurance, provides certain services, etc. °- Physician Referral Service- 1-800-533-3463 ° °Chronic Pain Problems: °Organization         Address  Phone   Notes  °[facility]  (336) 297-2271 Patients need to be referred by their primary care doctor.  ° °Medication Assistance: °Organization         Address  Phone   Notes  °[place] [facility] [address] °[place], [place] [address] (336) 641-8030 --Must be a resident of [place] °-- Must have NO insurance coverage whatsoever (no Medicaid/ Medicare, etc.) °-- The pt. MUST have a primary care doctor that directs their care regularly and follows them in the community °  °[facility]  (866) 331-1348   °[employer]  (888) 892-1162   ° °Agencies that provide inexpensive medical care: °Organization         Address  Phone   Notes  °[facility]  (336) 832-8035   °[facility]    (336) 832-7272   °[facility] [address] °[place], [place] [address] (336) 832-4777   °[facility] [address], °[place] (336) 271-4999   °[facility]    (336) 373-0678   °[facility]    (336) 272-1050   °[facility] ° [address], [place] Phone:  (336) 832-4444, Fax:  (336) 832-4440 Hours of Operation:  9 am - 6 pm, M-F.  Also accepts Medicaid/Medicare and self-pay.  °[facility] ° [address], [place] Phone: (336) 832-3150, Fax: (336) 832-3151. Hours of Operation:  8:30 am - 5:30 pm, M-F.  Also accepts Medicaid and self-pay.  °[facility] [place] [address], [place] Phone: (336) 878-6027   °[facility] [address], [place], [place] (336)723-1848, Ext. 123 Mondays & Thursdays: 7-9 AM.  First 15  patients are seen on a first come, first serve basis. °  ° °Medicaid-accepting [place] Providers: ° °Organization         Address  Phone   Notes  °[facility] [address], [place] (336) 641-2100 Also accepts self-pay patients.  °[facility] [address], [place] ° (336) 856-9996   °[facility] [address], [place] (336) 288-8857   °[facility] [address] [place] [address], [place] (336) 299-7000   °Veita Bland [address], [place]  ° (336) 373-1557 Only accepts [place] Access Medicaid patients after they have their name applied to their card.  ° °Self-Pay (no insurance) in [place]: ° °Organization         Address  Phone   Notes  °Sickle Cell Patients, [facility] [address], [place] (336) 832-1970   °[facility] [address], [place] (336) 832-4400   °[facility] ° [address], [place] (336) 992-4800   °[facility]/Dr. Osei-Bonsu ° [address] [place] [address], [place] or [address], [place] (336) 841-8500 Phone number for both [place] and [place] locations is the same.  °[facility] [address], [place] (336) 299-0000   °[facility] [address] [place] [address], [place] or [address] (336) 852-7530 °(336) 878-2260   °[facility] [address], [place] (336) 350-1642, phone; (336) 294-5005, fax Sees patients 1st and 3rd Saturday of every month.  Must not qualify for public or private insurance (i.e. Medicaid, Medicare, [place] Health Choice, Veterans' Benefits) • Household income should be no more than 200% of the poverty level •The clinic cannot treat you if you are pregnant or think you are pregnant • Sexually transmitted diseases are not treated at the clinic.  ° ° °Dental  Care: °Organization         Address  Phone  Notes  °[facility] [facility] [address], [place] (336) 641-6152 Accepts children up to age 21 who are enrolled in Medicaid or [place] Health Choice; pregnant women with a Medicaid card; and children who have applied for Medicaid or [place] Health Choice, but were declined, whose parents can pay a reduced fee at time of service.  °[facility] [place]  [address], [place] (336) 641-7733 Accepts children up to age 21 who are enrolled in Medicaid or [place] Health Choice; pregnant women with a Medicaid card; and children who have applied for Medicaid or [place] Health Choice, but were declined, whose parents can pay a reduced fee at time of service.  °[facility] ° [address], [place] (  336) 641-4533 Patients are seen by appointment only. Walk-ins are not accepted. [facility] will see patients 18 years of age and older. °Monday - Tuesday (8am-5pm) °Most Wednesdays (8:30-5pm) °$30 per visit, cash only  °[facility] ° [address], [place] (336) 641-4533 Patients are seen by appointment only. Walk-ins are not accepted. [facility] will see patients 18 years of age and older. °One Wednesday Evening (Monthly: Volunteer Based).  $30 per visit, cash only  °[facility]  (919) 537-3737 for adults; Children under age 4, call Graduate Pediatric Dentistry at (919) [phone]. Children aged 4-14, please call (919) 537-3737 to request a pediatric application. ° Dental services are provided in all areas of dental care including fillings, crowns and bridges, complete and partial dentures, implants, gum treatment, root canals, and extractions. Preventive care is also provided. Treatment is provided to both adults and children. °Patients are selected via a lottery and there is often a waiting list. °  °[facility] [address], °[place] ° (336) 763-8833 [email or web address] °  °[facility] [address], [place], [place] (336)723-1848, Ext. 123 Second and Fourth Thursday of each month, opens at 6:30 AM; Clinic ends at 9 AM.  Patients are seen on a first-come first-served basis, and a limited number are seen during each clinic.  ° °[facility] ° [address], [place], [place] (336) 723-7904   Eligibility Requirements °You must have lived in [place], [place], or [place] counties for at least the last three months. °  You cannot be eligible for state or federal sponsored healthcare insurance, including Veterans Administration, Medicaid, or Medicare. °  You generally cannot be eligible for healthcare insurance through your employer.  °  How to apply: °Eligibility screenings are held every Tuesday and Wednesday afternoon from 1:00 pm until 4:00 pm. You do not need an appointment for the interview!  °[facility] [address], [place], [place] 336-631-2330   °[facility] [facility]  336-342-8273   °[facility]  336-703-3100   °[facility]  336-570-6415   ° °Behavioral Health Resources in the Community: °Intensive Outpatient Programs °Organization         Address  Phone  Notes  °[facility] [address], [place], [place] 336-878-6098   °[facility] [address], [place], [place] 336-832-9800   °[facility] [address], [place], [place] ° 336-882-2125   °[facility] [address],  °[place], [place] 1-800-853-5163 or 336-641-4981   °Substance Abuse Resources °Organization         Address  Phone  Notes  °[facility]  336-882-2125   °[facility]  336-784-9470   °[facility]  336-285-9073   °[facility]  336-845-3988   °Residential & Outpatient Substance Abuse Program  1-800-659-3381    °Psychological Services °Organization         Address  Phone  Notes  °[facility]  336- 832-9600   °[facility]  336- 378-7881   °[facility] [address], [place] 1-800-853-5163 or 336-641-4981   ° °Mobile Crisis Teams °Organization         Address  Phone  Notes  °[facility], Mobile Crisis Care Unit  1-877-626-1772   °Assertive °[facility] ° [address] [place], [place] 336-834-9664   °Sharon DeEsch [address] °[place] 336-554-5454   ° °Self-Help/Support Groups °Organization         Address  Phone             Notes  °[facility] - variety of support groups  336- 373-1402 Call for more information  °Narcotics Anonymous (NA), [facility] [address], °[place]  2 meetings at this location  ° °Residential Treatment Programs °Organization         Address  Phone  Notes  °[facility] [address],    °[place]  1-866-801-8205   °[facility] ° [address], [place], [place] 704-293-8524   °[facility] [address], [place] 336-845-3988 Admissions: 8am-3pm M-F  °[facility] [address]    °[place], [place] 336-841-1104   °[facility] [address], [place], [place] 336-379-7146   °[facility] [address]  °[place], [place] 336-285-9073   °[facility] - Intensive Outpatient [address], , [place] 336-852-3033   °[facility] [address]  °[place], [place] 1-877-615-2722 or 336-784-9470   °[facility] [address] , [place] 336-227-7417 Accepts Medicaid  °[facility] [address]  ° [place] 1-800-659-3381 Substance Abuse/Addiction Treatment  ° °[place] Behavioral Health Resources °Organization         Address  Phone  Notes  °[facility]  (888) 581-9988   °Julie Brannon, PhD [address] [place], [place]   (336) 349-5553 or (336) 951-0000   ° Behavioral   [address] °[place], [place] (336) 349-4454   °[facility] [address], [place], [place] (336) 342-8316 Insurance/Medicaid/sponsorship through [facility]  °[facility] [address]                                    [place], [place] (336) 342-8316 Therapy/tele-psych/case  °[facility] [address]  ° [place], [place] (336) 349-2233    °Dr. Arfeen  (336) 349-4544   °[facility] [place]  [employer] [place] [facility] 1) [address], [place] °2) [address], [place] °3)  [address], [place] (336) 349-3220 °(336) 342-7768 ° °(336) 342-8140   °[place] [facility] (336) 342-1394 or (336) 342-3537 (After Hours)    ° ° ° ° °
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
## 2014-09-06 ENCOUNTER — Ambulatory Visit: Payer: Medicaid Other | Attending: Internal Medicine | Admitting: Physical Therapy

## 2014-09-08 NOTE — ED Provider Notes (Signed)
Medical screening examination/treatment/procedure(s) were performed by non-physician practitioner and as supervising physician I was immediately available for consultation/collaboration.   EKG Interpretation None        Matthew Gentry, MD 09/08/14 1523 

## 2014-11-10 ENCOUNTER — Encounter (HOSPITAL_COMMUNITY): Payer: Self-pay | Admitting: *Deleted

## 2014-11-10 ENCOUNTER — Emergency Department (HOSPITAL_COMMUNITY): Payer: No Typology Code available for payment source

## 2014-11-10 ENCOUNTER — Emergency Department (HOSPITAL_COMMUNITY)
Admission: EM | Admit: 2014-11-10 | Discharge: 2014-11-10 | Disposition: A | Discharge status: Home / Self Care | Payer: No Typology Code available for payment source | Attending: Emergency Medicine | Admitting: Emergency Medicine

## 2014-11-10 DIAGNOSIS — G8929 Other chronic pain: Secondary | ICD-10-CM | POA: Insufficient documentation

## 2014-11-10 DIAGNOSIS — Y998 Other external cause status: Secondary | ICD-10-CM | POA: Diagnosis not present

## 2014-11-10 DIAGNOSIS — S199XXA Unspecified injury of neck, initial encounter: Secondary | ICD-10-CM | POA: Diagnosis present

## 2014-11-10 DIAGNOSIS — Z7952 Long term (current) use of systemic steroids: Secondary | ICD-10-CM | POA: Insufficient documentation

## 2014-11-10 DIAGNOSIS — Z79899 Other long term (current) drug therapy: Secondary | ICD-10-CM | POA: Diagnosis not present

## 2014-11-10 DIAGNOSIS — Y9389 Activity, other specified: Secondary | ICD-10-CM | POA: Insufficient documentation

## 2014-11-10 DIAGNOSIS — Z9889 Other specified postprocedural states: Secondary | ICD-10-CM | POA: Insufficient documentation

## 2014-11-10 DIAGNOSIS — Y92524 Gas station as the place of occurrence of the external cause: Secondary | ICD-10-CM | POA: Insufficient documentation

## 2014-11-10 DIAGNOSIS — Z8781 Personal history of (healed) traumatic fracture: Secondary | ICD-10-CM | POA: Diagnosis not present

## 2014-11-10 DIAGNOSIS — S134XXA Sprain of ligaments of cervical spine, initial encounter: Secondary | ICD-10-CM | POA: Diagnosis not present

## 2014-11-10 DIAGNOSIS — Z791 Long term (current) use of non-steroidal anti-inflammatories (NSAID): Secondary | ICD-10-CM | POA: Insufficient documentation

## 2014-11-10 DIAGNOSIS — S139XXA Sprain of joints and ligaments of unspecified parts of neck, initial encounter: Secondary | ICD-10-CM

## 2014-11-10 NOTE — ED Notes (Signed)
Pt. Refused wheelchair 
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
## 2014-11-10 NOTE — Discharge Instructions (Signed)
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
## 2015-01-31 ENCOUNTER — Encounter: Payer: Self-pay | Admitting: Podiatry

## 2015-01-31 ENCOUNTER — Ambulatory Visit (INDEPENDENT_AMBULATORY_CARE_PROVIDER_SITE_OTHER): Payer: Medicaid Other | Admitting: Podiatry

## 2015-01-31 ENCOUNTER — Ambulatory Visit (INDEPENDENT_AMBULATORY_CARE_PROVIDER_SITE_OTHER): Payer: Medicaid Other

## 2015-01-31 VITALS — BP 115/61 | HR 57 | Resp 16

## 2015-01-31 DIAGNOSIS — Q828 Other specified congenital malformations of skin: Secondary | ICD-10-CM

## 2015-01-31 DIAGNOSIS — M79673 Pain in unspecified foot: Secondary | ICD-10-CM

## 2015-01-31 DIAGNOSIS — M2040 Other hammer toe(s) (acquired), unspecified foot: Secondary | ICD-10-CM

## 2015-01-31 NOTE — Progress Notes (Signed)
   Subjective:    Patient ID: Clinton LoosenMichael S Starr, male    DOB: 21-Oct-1971, 44 y.o.   MRN: [id number]  HPI Comments: "I have bad feet and they hurt"  Patient c/o aching plantar foot bilateral for several months. He has several callused areas. The 2nd toe left is hammered with callused area interdigital. He has had previous surgeries. He trims areas down with an "egg". He has a few pairs orthotics. Wears wide shoes to accommodate.   Foot Pain  Toe Pain       Review of Systems  Musculoskeletal: Positive for gait problem.  All other systems reviewed and are negative.      Objective:   Physical Exam: I have reviewed his past medical history medications allergies surgery social history and review of systems. Pulses are strongly palpable. Neurologic sensorium is intact for Semmes-Weinstein monofilament. Deep tendon reflexes are intact bilateral and muscle strength is 5 over 5 dorsiflexors plantar flexors and inverters and everters all into the musculature is intact. Orthopedic evaluation demonstrates all joints distal ankle range of motion without crepitus rigid hammertoe deformities 2 and 3 bilateral. Mild hallux valgus deformities resulting in irritation of the skin between the toes. Pes planus is noted bilateral. Cutaneous evaluationsupple well-hydrated cutis multiple porokeratotic lesions and reactive hyperkeratosis bilateral.        Assessment & Plan:  Assessment: Reactive hyperkeratosis bilateral. Hammertoe deformity bilateral.  Plan: Encouraged him to follow-up with his orthopedist for the pain in his feet and legs possibly associated with his back injury and previous neck surgery. I did debride reactive hyperkeratosis to the best of my ability today.

## 2015-04-14 ENCOUNTER — Ambulatory Visit: Payer: Medicaid Other | Admitting: Podiatry

## 2015-04-17 ENCOUNTER — Ambulatory Visit (INDEPENDENT_AMBULATORY_CARE_PROVIDER_SITE_OTHER): Payer: Medicaid Other | Admitting: Podiatry

## 2015-04-17 ENCOUNTER — Encounter: Payer: Self-pay | Admitting: Podiatry

## 2015-04-17 VITALS — BP 118/68 | HR 52 | Ht 69.0 in | Wt 197.0 lb

## 2015-04-17 DIAGNOSIS — Q828 Other specified congenital malformations of skin: Secondary | ICD-10-CM

## 2015-04-17 DIAGNOSIS — M21969 Unspecified acquired deformity of unspecified lower leg: Secondary | ICD-10-CM | POA: Insufficient documentation

## 2015-04-17 DIAGNOSIS — M79606 Pain in leg, unspecified: Secondary | ICD-10-CM

## 2015-04-17 DIAGNOSIS — M216X9 Other acquired deformities of unspecified foot: Secondary | ICD-10-CM | POA: Diagnosis not present

## 2015-04-17 NOTE — Patient Instructions (Signed)
Seen for painful feet. All calluses debrided. Surgery consent form reviewed for Cotton osteotomy with bone graft left foot.
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
## 2015-04-27 DIAGNOSIS — M2042 Other hammer toe(s) (acquired), left foot: Secondary | ICD-10-CM | POA: Diagnosis not present

## 2015-04-27 DIAGNOSIS — M216X2 Other acquired deformities of left foot: Secondary | ICD-10-CM | POA: Diagnosis not present

## 2015-04-27 HISTORY — PX: OTHER SURGICAL HISTORY: SHX169

## 2015-05-02 ENCOUNTER — Ambulatory Visit (INDEPENDENT_AMBULATORY_CARE_PROVIDER_SITE_OTHER): Payer: Medicaid Other | Admitting: Podiatry

## 2015-05-02 ENCOUNTER — Encounter: Payer: Self-pay | Admitting: Podiatry

## 2015-05-02 VITALS — BP 128/73 | HR 66

## 2015-05-02 DIAGNOSIS — Z9889 Other specified postprocedural states: Secondary | ICD-10-CM

## 2015-05-02 NOTE — Progress Notes (Signed)
Doing well following Cotton osteotomy left (04/27/15). All toes have normal color.  No edema noted. Denies any problem. Continue with cast and minimum ambulation.

## 2015-05-02 NOTE — Patient Instructions (Signed)
Post op left foot. Doing well with cast. Return in 3 weeks.

## 2015-05-09 ENCOUNTER — Encounter: Payer: Self-pay | Admitting: Podiatry

## 2015-05-09 ENCOUNTER — Ambulatory Visit (INDEPENDENT_AMBULATORY_CARE_PROVIDER_SITE_OTHER): Payer: Medicaid Other | Admitting: Podiatry

## 2015-05-09 DIAGNOSIS — Z9889 Other specified postprocedural states: Secondary | ICD-10-CM
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
## 2015-05-09 NOTE — Patient Instructions (Signed)
Cast replaced. Keep the cast clean and dry. Do minimum ambulation. Return in 3 weeks.

## 2015-05-23 ENCOUNTER — Encounter: Payer: Medicaid Other | Admitting: Podiatry

## 2015-05-30 ENCOUNTER — Ambulatory Visit (INDEPENDENT_AMBULATORY_CARE_PROVIDER_SITE_OTHER): Payer: Medicaid Other | Admitting: Podiatry

## 2015-05-30 ENCOUNTER — Encounter: Payer: Self-pay | Admitting: Podiatry

## 2015-05-30 VITALS — BP 117/64 | HR 55

## 2015-05-30 DIAGNOSIS — M79673 Pain in unspecified foot: Secondary | ICD-10-CM | POA: Diagnosis not present

## 2015-05-30 DIAGNOSIS — M21962 Unspecified acquired deformity of left lower leg: Secondary | ICD-10-CM

## 2015-05-30 DIAGNOSIS — M79606 Pain in leg, unspecified: Secondary | ICD-10-CM

## 2015-05-30 DIAGNOSIS — M21969 Unspecified acquired deformity of unspecified lower leg: Secondary | ICD-10-CM

## 2015-05-30 DIAGNOSIS — M216X9 Other acquired deformities of unspecified foot: Secondary | ICD-10-CM

## 2015-05-30 DIAGNOSIS — Q828 Other specified congenital malformations of skin: Secondary | ICD-10-CM

## 2015-05-30 DIAGNOSIS — M79605 Pain in left leg: Secondary | ICD-10-CM

## 2015-05-30 NOTE — Patient Instructions (Signed)
Cast removed. X-ray look good. In CAM walker for the next 3 weeks.

## 2015-05-30 NOTE — Progress Notes (Signed)
5 weeks post op since [employer] with bone graft left foot.  Patient complaining of severe foot pain on left foot.  Post op X-ray look good with good graft position. Right foot has multiple keratosis that are painful, on great toe, under 5th, on plantar heel, posterolateral heel. All lesions debrided. Placed left foot in CAM walker.  Return in 3 weeks.

## 2015-05-31 ENCOUNTER — Encounter (HOSPITAL_COMMUNITY): Payer: Self-pay | Admitting: Vascular Surgery

## 2015-05-31 ENCOUNTER — Emergency Department (HOSPITAL_COMMUNITY)
Admission: EM | Admit: 2015-05-31 | Discharge: 2015-05-31 | Disposition: A | Discharge status: Home / Self Care | Payer: Medicaid Other | Attending: Emergency Medicine | Admitting: Emergency Medicine

## 2015-05-31 DIAGNOSIS — Z8781 Personal history of (healed) traumatic fracture: Secondary | ICD-10-CM | POA: Insufficient documentation

## 2015-05-31 DIAGNOSIS — Z79899 Other long term (current) drug therapy: Secondary | ICD-10-CM | POA: Diagnosis not present

## 2015-05-31 DIAGNOSIS — Z720 Tobacco use: Secondary | ICD-10-CM | POA: Diagnosis not present

## 2015-05-31 DIAGNOSIS — R21 Rash and other nonspecific skin eruption: Secondary | ICD-10-CM | POA: Diagnosis present

## 2015-05-31 DIAGNOSIS — L237 Allergic contact dermatitis due to plants, except food: Secondary | ICD-10-CM | POA: Diagnosis not present

## 2015-05-31 HISTORY — DX: Bipolar disorder, unspecified: F31.9

## 2015-05-31 MED ORDER — PREDNISONE 10 MG (21) PO TBPK: 10.0000 mg | ORAL_TABLET | Freq: Every day | ORAL | Status: DC

## 2015-05-31 MED ORDER — METHYLPREDNISOLONE SODIUM SUCC 125 MG IJ SOLR
125.0000 mg | Freq: Once | INTRAMUSCULAR | Status: AC
  Administered 2015-05-31: 125 mg via INTRAMUSCULAR

## 2015-05-31 NOTE — ED Notes (Signed)
Pt reports to the ED for eval of rash and itching to bilateral arms, neck, and back. He had been outside cleaning vines off his fence before the rash appeared. Pt has tried calamine lotion with no relief. No facial involvement. Airway intact. Pt A&Ox4, resp e/u, and skin warm and dry.

## 2015-05-31 NOTE — Discharge Instructions (Signed)

## 2015-05-31 NOTE — ED Provider Notes (Signed)
CSN: [id number]     Arrival date & time 05/31/15  1126 History  This chart was scribed for non-physician practitioner, Cheron Schaumann, PA-C working with No att. providers found by Placido Sou, ED scribe. This patient was seen in room TR07C/TR07C and the patient's care was started at 11:42 AM.     No chief complaint on file.  The history is provided by the patient. No language interpreter was used.    HPI Comments: Clinton Allen is a 44 y.o. male who presents to the Emergency Department complaining of a rash to his neck, torso and bilateral arms with onset a few days ago. He mentions clearing vines from the fence in his yard with the symptoms following thereafter. He further notes applying calamine lotion with no relief of symptoms. Pt notes nasal congestion and eye mucous as unrelated symptoms. He denies any SOB or other associated symptoms.  Past Medical History  Diagnosis Date  . Neck fracture    Past Surgical History  Procedure Laterality Date  . Neck surgery    . Toe surgery    . Cotton osteotomy w/ bone graft Left 04/27/2015  . Hammer toe repair Left 04/27/2015    Lt #2   No family history on file. History  Substance Use Topics  . Smoking status: Current Every Day Smoker  . Smokeless tobacco: Never Used  . Alcohol Use: 0.0 oz/week    0 Standard drinks or equivalent per week    Review of Systems  Respiratory: Negative for shortness of breath.   Skin: Positive for rash. Negative for color change and pallor.  All other systems reviewed and are negative.     Allergies  Eggs or egg-derived products  Home Medications   Prior to Admission medications   Medication Sig Start Date End Date Taking? Authorizing Provider  albuterol (PROVENTIL HFA;VENTOLIN HFA) 108 (90 BASE) MCG/ACT inhaler Inhale 2 puffs into the lungs every 4 (four) hours as needed for wheezing or shortness of breath. 09/05/14   Joni Reining Pisciotta, PA-C  cephALEXin (KEFLEX) 500 MG capsule  04/11/15    Historical Provider, MD  clotrimazole-betamethasone (LOTRISONE) cream  04/04/15   Historical Provider, MD  HYDROcodone-acetaminophen (NORCO/VICODIN) 5-325 MG per tablet  04/08/15   Historical Provider, MD  oxaprozin (DAYPRO) 600 MG tablet Take 600 mg by mouth. 09/08/14   Historical Provider, MD  Oxcarbazepine (TRILEPTAL) 300 MG tablet Take 300 mg by mouth 3 (three) times daily. 01/28/15   Historical Provider, MD  oxyCODONE-acetaminophen (PERCOCET) 10-325 MG per tablet  04/27/15   Historical Provider, MD  sertraline (ZOLOFT) 100 MG tablet Take 100 mg by mouth daily. 01/28/15   Historical Provider, MD   There were no vitals taken for this visit. Physical Exam  Constitutional: He is oriented to person, place, and time. He appears well-developed and well-nourished. No distress.  HENT:  Head: Normocephalic and atraumatic.  Mouth/Throat: Oropharynx is clear and moist.  Eyes: Conjunctivae and EOM are normal. Pupils are equal, round, and reactive to light.  Neck: Normal range of motion. Neck supple. No tracheal deviation present.  Cardiovascular: Normal rate.   Pulmonary/Chest: Effort normal and breath sounds normal. No respiratory distress.  Abdominal: Soft.  Musculoskeletal: Normal range of motion.  Neurological: He is alert and oriented to person, place, and time.  Skin: Skin is warm and dry.  Red raised linear rash, arms, neck legs,   Psychiatric: He has a normal mood and affect. His behavior is normal.  Nursing note and vitals reviewed.  ED Course  Procedures  DIAGNOSTIC STUDIES: Oxygen Saturation is 100% on RA, normal by my interpretation.    COORDINATION OF CARE: 11:44 AM Discussed treatment plan with pt at bedside and pt agreed to plan.  Labs Review Labs Reviewed - No data to display  Imaging Review No results found.   EKG Interpretation None      MDM   Final diagnoses:  Poison ivy   Solumedrol im Prednisone taper AVS  I personally performed the services in this  documentation, which was scribed in my presence.  The recorded information has been reviewed and considered.   Barnet Pall.  Lonia Skinner [place], PA-C 05/31/15 1345  Pricilla Loveless, MD 06/01/15 [id number]

## 2015-06-20 ENCOUNTER — Encounter: Payer: Self-pay | Admitting: Podiatry

## 2015-06-20 ENCOUNTER — Ambulatory Visit (INDEPENDENT_AMBULATORY_CARE_PROVIDER_SITE_OTHER): Payer: Medicaid Other | Admitting: Podiatry

## 2015-06-20 VITALS — BP 118/68 | HR 54

## 2015-06-20 DIAGNOSIS — M216X9 Other acquired deformities of unspecified foot: Secondary | ICD-10-CM

## 2015-06-20 DIAGNOSIS — M21962 Unspecified acquired deformity of left lower leg: Secondary | ICD-10-CM

## 2015-06-20 MED ORDER — CLOTRIMAZOLE-BETAMETHASONE 1-0.05 % EX CREA: TOPICAL_CREAM | Freq: Two times a day (BID) | CUTANEOUS | Status: DC

## 2015-06-20 NOTE — Patient Instructions (Signed)
Post op wound healing well. Pre op consent form discussed for Cotton osteotomy with bone graft right.

## 2015-06-20 NOTE — Progress Notes (Signed)
Subjective: 8 weeks post op since Cotton osteomy with bone graft left foot.  Patient came in wearing CAM walker. Stated that left foot has less callus and less pain.  Patient complaining of minor irritation over incision area left.  Patient requests the right foot to be fixed.  Objective: Well healed surgical site with normal scarring.  Tender over incision site.  Post op X-ray look good with good graft position. No changes noted.  Positive of residual callus on great toe, under 5th, on plantar heel, posterolateral heel bilateral..  Assessment: Satisfactory post op wound healing left. Short and elevated first ray right. Severe plantar porokeratosis sub 5 right.   Plan: All lesions debrided. Consent form reviewed for Cotton osteotomy with bone graft right.  Lotrisone cream re ordered as per request.  May wear regular shoes.

## 2015-06-29 DIAGNOSIS — M216X1 Other acquired deformities of right foot: Secondary | ICD-10-CM | POA: Diagnosis not present

## 2015-06-29 HISTORY — PX: OTHER SURGICAL HISTORY: SHX169

## 2015-06-30 ENCOUNTER — Telehealth: Payer: Self-pay | Admitting: *Deleted

## 2015-06-30 NOTE — Telephone Encounter (Signed)
06/30/15 Called [facility] @ [phone] and ask them to change the RX to Naprosyn 500mg , #30 / BID

## 2015-07-10 ENCOUNTER — Ambulatory Visit (INDEPENDENT_AMBULATORY_CARE_PROVIDER_SITE_OTHER): Payer: Medicaid Other | Admitting: Podiatry

## 2015-07-10 ENCOUNTER — Encounter: Payer: Self-pay | Admitting: Podiatry

## 2015-07-10 VITALS — BP 129/73 | HR 60

## 2015-07-10 DIAGNOSIS — Z9889 Other specified postprocedural states: Secondary | ICD-10-CM

## 2015-07-10 NOTE — Patient Instructions (Signed)
11 days post op wound. Tolerating well with cast on right foot. Will change next week.

## 2015-07-10 NOTE — Progress Notes (Signed)
11 days post op, Cotton osteotomy right foot.

## 2015-07-17 ENCOUNTER — Ambulatory Visit (INDEPENDENT_AMBULATORY_CARE_PROVIDER_SITE_OTHER): Payer: Medicaid Other | Admitting: Podiatry

## 2015-07-17 ENCOUNTER — Encounter: Payer: Self-pay | Admitting: Podiatry

## 2015-07-17 DIAGNOSIS — M216X9 Other acquired deformities of unspecified foot: Secondary | ICD-10-CM | POA: Diagnosis not present

## 2015-07-17 DIAGNOSIS — M21961 Unspecified acquired deformity of right lower leg: Secondary | ICD-10-CM

## 2015-07-17 DIAGNOSIS — M79604 Pain in right leg: Secondary | ICD-10-CM

## 2015-07-17 MED ORDER — OXYCODONE-ACETAMINOPHEN 10-325 MG PO TABS: 1.0000 | ORAL_TABLET | Freq: Three times a day (TID) | ORAL | Status: DC | PRN

## 2015-07-17 NOTE — Patient Instructions (Signed)
Post op right foot. Wound healing is good without complication. Cast replaced with Fiberglass cast right lower limb.  Continue with minimum ambulation as tolerated. Return in 3 weeks.

## 2015-07-17 NOTE — Progress Notes (Signed)
18 days post op following Cotton osteotomy with bone graft right foot. Patient came in with worn out cast. Cast removed. Wound healing is normal. No edema or erythema noted. Skin edges well coapted and dry.

## 2015-07-31 ENCOUNTER — Encounter: Payer: Self-pay | Admitting: Podiatry

## 2015-07-31 ENCOUNTER — Ambulatory Visit (INDEPENDENT_AMBULATORY_CARE_PROVIDER_SITE_OTHER): Payer: Medicaid Other | Admitting: Podiatry

## 2015-07-31 VITALS — BP 122/69 | HR 64

## 2015-07-31 DIAGNOSIS — Z9889 Other specified postprocedural states: Secondary | ICD-10-CM | POA: Insufficient documentation

## 2015-07-31 NOTE — Patient Instructions (Signed)
Cast removed and placed in CAM walker. Return in 3 weeks.

## 2015-07-31 NOTE — Progress Notes (Signed)
5 weeks post op following Cotton osteotomy with bone graft.  Cast has been cracked from walking a lot. Cast removed. Placed in CAM walker with instruction. Return in 3 weeks.

## 2015-08-07 ENCOUNTER — Ambulatory Visit: Payer: Medicaid Other | Admitting: Podiatry

## 2015-08-22 ENCOUNTER — Encounter: Payer: Self-pay | Admitting: Podiatry

## 2015-08-22 ENCOUNTER — Ambulatory Visit (INDEPENDENT_AMBULATORY_CARE_PROVIDER_SITE_OTHER): Payer: Medicaid Other | Admitting: Podiatry

## 2015-08-22 DIAGNOSIS — M216X9 Other acquired deformities of unspecified foot: Secondary | ICD-10-CM | POA: Diagnosis not present

## 2015-08-22 DIAGNOSIS — Q828 Other specified congenital malformations of skin: Secondary | ICD-10-CM

## 2015-08-22 DIAGNOSIS — M79606 Pain in leg, unspecified: Secondary | ICD-10-CM

## 2015-08-22 DIAGNOSIS — M21961 Unspecified acquired deformity of right lower leg: Secondary | ICD-10-CM

## 2015-08-22 NOTE — Patient Instructions (Signed)
8 weeks post surgical visit. Doing well. Calluses trimmed. X-ray look normal with good bone healing. Return as needed.

## 2015-08-22 NOTE — Progress Notes (Signed)
Subjective: 8 weeks post op since [employer] with bone graft right foot and 2 months since left foot surgery cotton osteotomy with graft..  Patient came in wearing CAM walker on right.  Stated that left foot hurts from callus under the 5th toe. Patient wants them trimmed.   Objective: Well healed surgical site with normal scarring.  Tender over incision site.  Post op X-ray of right foot show good bone to bone contact. Normal bone healing noted.  Painful calluses under 2nd and 5th MPJ left, plantar lateral heels bilateral.  Callus under the 5th MPJ right foot noted.   Assessment: Satisfactory post op wound healing Right Severe plantar porokeratosis sub 5 bilateral, symptomatic.  Plan: All lesions debrided. May use regular shoes as tolerated and light ambulation for the next 2 weeks.

## 2015-09-05 ENCOUNTER — Ambulatory Visit (INDEPENDENT_AMBULATORY_CARE_PROVIDER_SITE_OTHER): Payer: Medicaid Other | Admitting: Podiatry

## 2015-09-05 ENCOUNTER — Encounter: Payer: Self-pay | Admitting: Podiatry

## 2015-09-05 DIAGNOSIS — B351 Tinea unguium: Secondary | ICD-10-CM | POA: Diagnosis not present

## 2015-09-05 DIAGNOSIS — M79606 Pain in leg, unspecified: Secondary | ICD-10-CM

## 2015-09-05 DIAGNOSIS — Q828 Other specified congenital malformations of skin: Secondary | ICD-10-CM

## 2015-09-05 DIAGNOSIS — M79673 Pain in unspecified foot: Secondary | ICD-10-CM

## 2015-09-05 NOTE — Patient Instructions (Signed)
Post op wound healing is good. Having trouble with recurring calluses.  Return as needed for Routine Foot Care.

## 2015-09-05 NOTE — Progress Notes (Signed)
Subjective: Status post Cotton osteotomy Rt 06/27/15. Cotton osteotomy Lt 05/10/15.  Stated that he had his feet done last Friday. Calluses are still painful and right foot is still sore on surgery site. Patient requests calluses trimmed.   Objective: Dermatologic: Severe plantar calluses, under 2nd and 5th MPJ left foot, under both heels plantar and plantar lateral, under 5th MPJ right. New surgical wound has healed well.  Neurologic: All epicritic and tactile sensations grossly intact. Painful feet with ambulation.  Orthopedic: Maintained correction with plantar flexed first metatarsal.  Vascular: All pedal pulses are palpable. No edema or erythema noted.   Assessment: Post surgical Cotton osteotomy with bone graft bilateral. Hammer toe repair 2nd left. Painful feet from multiple plantar calluses.  Plan: Reviewed clinical findings and available treatment options. All calluses debrided.

## 2015-09-26 ENCOUNTER — Ambulatory Visit (INDEPENDENT_AMBULATORY_CARE_PROVIDER_SITE_OTHER): Payer: Medicaid Other | Admitting: Podiatry

## 2015-09-26 ENCOUNTER — Encounter: Payer: Self-pay | Admitting: Podiatry

## 2015-09-26 VITALS — BP 122/79 | HR 55

## 2015-09-26 DIAGNOSIS — M79606 Pain in leg, unspecified: Secondary | ICD-10-CM | POA: Diagnosis not present

## 2015-09-26 DIAGNOSIS — M21969 Unspecified acquired deformity of unspecified lower leg: Secondary | ICD-10-CM

## 2015-09-26 DIAGNOSIS — Q828 Other specified congenital malformations of skin: Secondary | ICD-10-CM

## 2015-09-26 NOTE — Progress Notes (Signed)
Subjective: 44 year old male presents stating, 'Right foot still hurt and swollen and left 2nd toe hurt and still cannot wear shoes. Still having calluses on bottom that are really bad.' This is 12 weeks post op since [employer] with bone graft right foot and 3 months since left foot surgery cotton osteotomy with graft. Has had calluses scraped at nail salon and they are still painful. Patient wants them trimmed.  He revealed callus forming palms of both hands.   Objective: Painful calluses under 2nd and 5th MPJ left, plantar lateral heels bilateral.  Callus under the 5th MPJ right foot noted.  Neurovascular status are within normal. Post surgical wound has healed well. Surgery helped right side more than the left. Left side calluses have cracked callus that has neurovascular endings exposed to dermal layer.  Assessment: Satisfactory post op wound healing bilateral.  Severe plantar porokeratosis sub 5 bilateral, symptomatic. 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   Plan: All callused lesions debrided. Return as needed.

## 2015-09-26 NOTE — Patient Instructions (Signed)
Seen for painful calluses. All debrided. Return as needed. 

## 2015-10-09 ENCOUNTER — Ambulatory Visit (INDEPENDENT_AMBULATORY_CARE_PROVIDER_SITE_OTHER): Payer: Medicaid Other | Admitting: Podiatry

## 2015-10-09 ENCOUNTER — Encounter: Payer: Self-pay | Admitting: Podiatry

## 2015-10-09 DIAGNOSIS — Q828 Other specified congenital malformations of skin: Secondary | ICD-10-CM | POA: Diagnosis not present

## 2015-10-09 DIAGNOSIS — M79606 Pain in leg, unspecified: Secondary | ICD-10-CM

## 2015-10-09 NOTE — Progress Notes (Signed)
Subjective: 44 year old male presents stating,top of both feet swells and pains at night. Bottom of both feet hurts from callus.  This is 14 weeks post op since [employer] with bone graft right foot and 3 and a half months since left foot surgery cotton osteotomy with graft. He is also having excess callus formation in palms of both hands.   Objective: Painful calluses under 2nd and 5th MPJ left, plantar lateral heels bilateral.  Callus under the 5th MPJ right foot noted.  Neurovascular status are within normal. Post surgical wound has healed well. Surgery helped right side more than the left. Left side calluses have cracked callus that has neurovascular endings exposed to dermal layer.  Assessment: Satisfactory post op wound healing bilateral.  Severe plantar porokeratosis sub 5 bilateral, symptomatic. 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   Plan: All callused lesions debrided. Return as needed.

## 2015-10-09 NOTE — Patient Instructions (Signed)
Seen for painful calluses. All debrided. Return as needed. 

## 2015-10-26 ENCOUNTER — Ambulatory Visit: Payer: Medicaid Other | Admitting: Podiatry

## 2015-10-30 ENCOUNTER — Ambulatory Visit: Payer: Medicaid Other | Admitting: Podiatry

## 2016-06-28 ENCOUNTER — Encounter (HOSPITAL_COMMUNITY): Payer: Self-pay | Admitting: Emergency Medicine

## 2016-06-28 ENCOUNTER — Emergency Department (HOSPITAL_COMMUNITY)
Admission: EM | Admit: 2016-06-28 | Discharge: 2016-06-28 | Disposition: A | Discharge status: Home / Self Care | Payer: Medicaid Other | Attending: Emergency Medicine | Admitting: Emergency Medicine

## 2016-06-28 ENCOUNTER — Emergency Department (HOSPITAL_COMMUNITY): Payer: Medicaid Other

## 2016-06-28 DIAGNOSIS — F17200 Nicotine dependence, unspecified, uncomplicated: Secondary | ICD-10-CM | POA: Insufficient documentation

## 2016-06-28 DIAGNOSIS — J40 Bronchitis, not specified as acute or chronic: Secondary | ICD-10-CM | POA: Diagnosis not present

## 2016-06-28 DIAGNOSIS — R0789 Other chest pain: Secondary | ICD-10-CM | POA: Diagnosis present

## 2016-06-28 LAB — I-STAT TROPONIN, ED: Troponin i, poc: 0 ng/mL (ref 0.00–0.08)

## 2016-06-28 LAB — I-STAT CHEM 8, ED
BUN: 15 mg/dL (ref 6–20)
CHLORIDE: 105 mmol/L (ref 101–111)
CREATININE: 1 mg/dL (ref 0.61–1.24)
Calcium, Ion: 1.19 mmol/L (ref 1.13–1.30)
GLUCOSE: 89 mg/dL (ref 65–99)
HEMATOCRIT: 41 % (ref 39.0–52.0)
HEMOGLOBIN: 13.9 g/dL (ref 13.0–17.0)
POTASSIUM: 4.2 mmol/L (ref 3.5–5.1)
Sodium: 143 mmol/L (ref 135–145)
TCO2: 27 mmol/L (ref 0–100)

## 2016-06-28 MED ORDER — ALBUTEROL SULFATE (2.5 MG/3ML) 0.083% IN NEBU
5.0000 mg | INHALATION_SOLUTION | Freq: Once | RESPIRATORY_TRACT | Status: AC
  Administered 2016-06-28: 5 mg via RESPIRATORY_TRACT
  Filled 2016-06-28: qty 6

## 2016-06-28 MED ORDER — IPRATROPIUM-ALBUTEROL 0.5-2.5 (3) MG/3ML IN SOLN: 3.0000 mL | Freq: Once | RESPIRATORY_TRACT | Status: DC

## 2016-06-28 MED ORDER — ALBUTEROL SULFATE HFA 108 (90 BASE) MCG/ACT IN AERS: 2.0000 | INHALATION_SPRAY | Freq: Four times a day (QID) | RESPIRATORY_TRACT | Status: DC | PRN

## 2016-06-28 MED ORDER — IPRATROPIUM BROMIDE 0.02 % IN SOLN
0.5000 mg | Freq: Once | RESPIRATORY_TRACT | Status: AC
Start: 2016-06-28 — End: 2016-06-28
  Administered 2016-06-28: 0.5 mg via RESPIRATORY_TRACT
  Filled 2016-06-28: qty 2.5

## 2016-06-28 MED ORDER — PREDNISONE 10 MG PO TABS: 60.0000 mg | ORAL_TABLET | Freq: Every day | ORAL | Status: DC

## 2016-06-28 NOTE — ED Notes (Signed)
Patient undressed, in gown, on monitor, continuous pulse oximetry and blood pressure cuff 

## 2016-06-28 NOTE — ED Notes (Signed)
Pt in hall way requesting to leave. Awaiting discharge papers from MD.

## 2016-06-28 NOTE — Discharge Instructions (Signed)

## 2016-06-28 NOTE — ED Notes (Signed)
Pt reports chest tightness for the last 2 days. Pt states he feels like he can't catch his breath. States he runs everyday, felt like he couldn't go running today. Pt speaking in full sentences, NAD. Walked back to room from triage. Pt states has been using prescribed inhalers for asthma but not helping. States using marijauna daily.

## 2016-06-28 NOTE — ED Notes (Signed)
Papers printed and given to pt. Pt upset because MD told pt he was ready to go "30 minutes" ago. apologized to pt for delay in papers. Prescriptions and discharge instructions reviewed with pt.

## 2016-06-28 NOTE — ED Provider Notes (Signed)
CSN: [id number]     Arrival date & time 06/28/16  0807 History   First MD Initiated Contact with Patient 06/28/16 0813     Chief Complaint  Patient presents with  . Chest Pain     (Consider location/radiation/quality/duration/timing/severity/associated sxs/prior Treatment) HPI Comments: Pt comes in with cc of chest tightness. Pt has no significant medical hx. Reports 2 days of chest tightness, generalized. Pain is constant, non radiating. Pt has some dib associated with that. Reports that he has had bronchitis in the past with similar symptoms. There is no cough, wheezing. Pt is not a smoker. Pt has no hx of PE, DVT and denies any exogenous estrogen use, long distance travels or surgery in the past 6 weeks, active cancer, recent immobilization. Pt denies any premature CAD.   ROS 10 Systems reviewed and are negative for acute change except as noted in the HPI.     Patient is a 45 y.o. male presenting with chest pain. The history is provided by the patient.  Chest Pain   Past Medical History  Diagnosis Date  . Neck fracture (HCC)   . Bipolar 1 disorder [facility](HCC)    Past Surgical History  Procedure Laterality Date  . Neck surgery    . Toe surgery    . Cotton osteotomy w/ bone graft Left 04/27/2015  . Hammer toe repair Left 04/27/2015    Lt #2  . Cotton osteotomy w/ bone graft Right 06/29/2015   History reviewed. No pertinent family history. Social History  Substance Use Topics  . Smoking status: Current Every Day Smoker  . Smokeless tobacco: Never Used  . Alcohol Use: 0.0 oz/week    0 Standard drinks or equivalent per week    Review of Systems  Cardiovascular: Positive for chest pain.      Allergies  Eggs or egg-derived products  Home Medications   Prior to Admission medications   Medication Sig Start Date End Date Taking? Authorizing Provider  albuterol (PROVENTIL HFA;VENTOLIN HFA) 108 (90 BASE) MCG/ACT inhaler Inhale 2 puffs into the lungs every 4 (four) hours as  needed for wheezing or shortness of breath. 09/05/14   Joni ReiningNicole Pisciotta, PA-C  cephALEXin (KEFLEX) 500 MG capsule  04/11/15   Historical Provider, MD  clotrimazole-betamethasone (LOTRISONE) cream Apply topically 2 (two) times daily. 06/20/15   Myeong Christianne Dolin Sheard, DPM  HYDROcodone-acetaminophen (NORCO/VICODIN) 5-325 MG per tablet  04/08/15   Historical Provider, MD  oxaprozin (DAYPRO) 600 MG tablet Take 600 mg by mouth. 09/08/14   Historical Provider, MD  Oxcarbazepine (TRILEPTAL) 300 MG tablet Take 300 mg by mouth 3 (three) times daily. 01/28/15   Historical Provider, MD  oxyCODONE-acetaminophen (PERCOCET) 10-325 MG per tablet Take 1 tablet by mouth every 8 (eight) hours as needed for pain. 07/17/15   Myeong O Sheard, DPM  predniSONE (STERAPRED UNI-PAK 21 TAB) 10 MG (21) TBPK tablet Take 1 tablet (10 mg total) by mouth daily. Take 6 tabs by mouth daily  for 2 days, then 5 tabs for 2 days, then 4 tabs for 2 days, then 3 tabs for 2 days, 2 tabs for 2 days, then 1 tab by mouth daily for 2 days 05/31/15   Elson AreasLeslie K Sofia, PA-C  sertraline (ZOLOFT) 100 MG tablet Take 100 mg by mouth daily. 01/28/15   Historical Provider, MD   BP 120/76 mmHg  Pulse 47  Temp(Src) 97.6 F (36.4 C) (Oral)  Resp 16  SpO2 100% Physical Exam  Constitutional: He is oriented to person, place, and time.  He appears well-developed.  HENT:  Head: Normocephalic and atraumatic.  Eyes: Conjunctivae and EOM are normal. Pupils are equal, round, and reactive to light.  Neck: Normal range of motion. Neck supple.  Cardiovascular: Regular rhythm and normal heart sounds.  Exam reveals no friction rub.   Pulmonary/Chest: Effort normal and breath sounds normal. No respiratory distress. He has no wheezes.  Abdominal: Soft. Bowel sounds are normal. He exhibits no distension. There is no tenderness. There is no rebound and no guarding.  Neurological: He is alert and oriented to person, place, and time.  Skin: Skin is warm.  Nursing note and vitals  reviewed.   ED Course  Procedures (including critical care time) Labs Review Labs Reviewed  Rosezena Sensor, ED  I-STAT CHEM 8, ED    Imaging Review Dg Chest 2 View  06/28/2016  CLINICAL DATA:  Left side chest pain for 2 days. Slight shortness of breath. EXAM: CHEST  2 VIEW COMPARISON:  09/05/2014 FINDINGS: Heart and mediastinal contours are within normal limits. No focal opacities or effusions. No acute bony abnormality. IMPRESSION: No active cardiopulmonary disease. Electronically Signed   By: Charlett Nose M.D.   On: 06/28/2016 08:55   I have personally reviewed and evaluated these images and lab results as part of my medical decision-making.   EKG Interpretation   Date/Time:  Friday June 28 2016 08:16:33 EDT Ventricular Rate:  51 PR Interval:    QRS Duration: 134 QT Interval:  474 QTC Calculation: 437 R Axis:   55 Text Interpretation:  Sinus rhythm Consider left atrial enlargement  Nonspecific intraventricular conduction delay No acute changes No  significant change since last tracing Nonspecific ST elevation Confirmed  by Rhunette Croft, MD, Janey Genta [phone]) on 06/28/2016 8:23:59 AM Also confirmed by  Rhunette Croft, MD, Janey Genta [phone]), editor Wandalee Ferdinand [phone])  on 06/28/2016  8:39:06 AM      MDM   Final diagnoses:  Bronchitis    Differential diagnosis includes: ACS syndrome CHF exacerbation Valvular disorder Myocarditis Pericarditis Pericardial effusion Pneumonia Pleural effusion Pulmonary edema PE Anemia Musculoskeletal pain   Chest tightness, hx of bronchitis / allergies and feeling similar. No cardiac hx. Pt has no wheezing or cough. Lungs are clear - bradycardia is now new per pt. He runs 4 miles daily, and his HR is always in the 40s. EKG is sinus. Breathing tx given, as pt has had bronchitis with chest tightness, and he feels a whole lot better.   Derwood Kaplan, MD 06/29/16 [phone]

## 2016-07-01 ENCOUNTER — Encounter (HOSPITAL_COMMUNITY): Payer: Self-pay | Admitting: Emergency Medicine

## 2016-07-01 ENCOUNTER — Emergency Department (HOSPITAL_COMMUNITY): Payer: Medicaid Other

## 2016-07-01 DIAGNOSIS — F17200 Nicotine dependence, unspecified, uncomplicated: Secondary | ICD-10-CM | POA: Insufficient documentation

## 2016-07-01 DIAGNOSIS — R0789 Other chest pain: Secondary | ICD-10-CM | POA: Diagnosis present

## 2016-07-01 LAB — BASIC METABOLIC PANEL
Anion gap: 5 (ref 5–15)
BUN: 19 mg/dL (ref 6–20)
CALCIUM: 8.9 mg/dL (ref 8.9–10.3)
CO2: 28 mmol/L (ref 22–32)
CREATININE: 1.05 mg/dL (ref 0.61–1.24)
Chloride: 107 mmol/L (ref 101–111)
GFR calc non Af Amer: >60 mL/min (ref >60)
Glucose, Bld: 86 mg/dL (ref 65–99)
Potassium: 3.4 mmol/L — ABNORMAL LOW (ref 3.5–5.1)
SODIUM: 140 mmol/L (ref 135–145)

## 2016-07-01 LAB — CBC
HCT: 37.7 % — ABNORMAL LOW (ref 39.0–52.0)
Hemoglobin: 12.2 g/dL — ABNORMAL LOW (ref 13.0–17.0)
MCH: 31.6 pg (ref 26.0–34.0)
MCHC: 32.4 g/dL (ref 30.0–36.0)
MCV: 97.7 fL (ref 78.0–100.0)
PLATELETS: 156 10*3/uL (ref 150–400)
RBC: 3.86 MIL/uL — AB (ref 4.22–5.81)
RDW: 12.7 % (ref 11.5–15.5)
WBC: 8.4 10*3/uL (ref 4.0–10.5)

## 2016-07-01 LAB — I-STAT TROPONIN, ED: TROPONIN I, POC: 0 ng/mL (ref 0.00–0.08)

## 2016-07-01 NOTE — ED Notes (Signed)
Pt states he has been having chest tightness for over 1 week ago and was seen here 3 days ago and was put on a steroid and inhaler that hasn't helped with the chest tightness.  Pt states today he started having some tightness in his entire abd. Pt is warm and dry.

## 2016-07-02 ENCOUNTER — Emergency Department (HOSPITAL_COMMUNITY)
Admission: EM | Admit: 2016-07-02 | Discharge: 2016-07-02 | Disposition: A | Discharge status: Home / Self Care | Payer: Medicaid Other | Attending: Emergency Medicine | Admitting: Emergency Medicine

## 2016-07-02 ENCOUNTER — Emergency Department (HOSPITAL_COMMUNITY): Payer: Medicaid Other

## 2016-07-02 DIAGNOSIS — R079 Chest pain, unspecified: Secondary | ICD-10-CM

## 2016-07-02 LAB — I-STAT TROPONIN, ED: TROPONIN I, POC: 0.01 ng/mL (ref 0.00–0.08)

## 2016-07-02 LAB — D-DIMER, QUANTITATIVE: D-Dimer, Quant: 2.41 ug/mL-FEU — ABNORMAL HIGH (ref 0.00–0.50)

## 2016-07-02 MED ORDER — IOPAMIDOL (ISOVUE-370) INJECTION 76%
INTRAVENOUS | Status: AC
  Administered 2016-07-02: 100 mL
  Filled 2016-07-02: qty 100

## 2016-07-02 NOTE — ED Provider Notes (Signed)
CSN: [id number]     Arrival date & time 07/01/16  2031 History  By signing my name below, I, Clinton Allen, attest that this documentation has been prepared under the direction and in the presence of Zadie Rhine, MD. Electronically Signed: Rosario Allen, ED Scribe. 07/02/2016. 2:25 AM.   Chief Complaint  Patient presents with  . Chest Pain   Patient is a 45 y.o. male presenting with chest pain. The history is provided by the patient. No language interpreter was used.  Chest Pain Pain quality: tightness   Pain radiates to:  Does not radiate Pain radiates to the back: no   Pain severity:  No pain Onset quality:  Gradual Duration:  5 days Timing:  Intermittent Progression:  Resolved Chronicity:  New Relieved by: activity/working out. Associated symptoms: abdominal pain (tightness)   Associated symptoms: no back pain, no dizziness, no fever, no nausea, not vomiting and no weakness    HPI Comments: Clinton Allen is a 45 y.o. male who presents to the Emergency Department complaining of gradual onset, intermittent chest pain x 5 days. He described his pain as a tightness, and was present all day PTA. He reports that his pain has resolved since waiting in the ED.  He has had mild SOB secondary to his chest tightness, and notes that the sensation of tightness is also present in his abdomen. Pt notes that he frequently works out, including running several miles and lifting weights, but he has not worked out since the onset of his symptoms. His reports that his pain is improved and not present when he is active and working out. He does take workout supplements including testosterone that he got from [facility], and states that he has been taking them "for at least 72 days". He notes that he has had similar pain in the past, and states that it was present around this time last year and was due to a URI. Pt is not on medications for his heart. No hx of MI or other heart issues. Denies  fever, vomiting, hemoptysis, leg swelling, syncope, back pain, dizziness, weakness, or myalgias. Pt smokes marijuana everyday  Past Medical History  Diagnosis Date  . Neck fracture (HCC)   . Bipolar 1 disorder [facility])    Past Surgical History  Procedure Laterality Date  . Neck surgery    . Toe surgery    . Cotton osteotomy w/ bone graft Left 04/27/2015  . Hammer toe repair Left 04/27/2015    Lt #2  . Cotton osteotomy w/ bone graft Right 06/29/2015   No family history on file. Social History  Substance Use Topics  . Smoking status: Current Every Day Smoker  . Smokeless tobacco: Never Used  . Alcohol Use: 0.0 oz/week    0 Standard drinks or equivalent per week    Review of Systems  Constitutional: Negative for fever.  Respiratory: Positive for chest tightness.   Cardiovascular: Positive for chest pain (tightness). Negative for leg swelling.  Gastrointestinal: Positive for abdominal pain (tightness). Negative for nausea and vomiting.       Negative for hemoptysis.   Musculoskeletal: Negative for myalgias and back pain.  Neurological: Negative for dizziness, syncope and weakness.  All other systems reviewed and are negative.  Allergies  Eggs or egg-derived products  Home Medications   Prior to Admission medications   Medication Sig Start Date End Date Taking? Authorizing Provider  albuterol (PROVENTIL HFA;VENTOLIN HFA) 108 (90 Base) MCG/ACT inhaler Inhale 2 puffs into the lungs  every 6 (six) hours as needed for wheezing or shortness of breath. 06/28/16   Derwood KaplanAnkit Nanavati, MD  cephALEXin (KEFLEX) 500 MG capsule  04/11/15   Historical Provider, MD  clotrimazole-betamethasone (LOTRISONE) cream Apply topically 2 (two) times daily. Patient not taking: Reported on 06/28/2016 06/20/15   Myeong Christianne Dolin Sheard, DPM  HYDROcodone-acetaminophen (NORCO/VICODIN) 5-325 MG tablet Take 1 tablet by mouth every 6 (six) hours as needed for moderate pain.    Historical Provider, MD  loratadine (CLARITIN) 10 MG  tablet Take 10 mg by mouth daily. 06/04/16   Historical Provider, MD  oxyCODONE-acetaminophen (PERCOCET) 10-325 MG per tablet Take 1 tablet by mouth every 8 (eight) hours as needed for pain. Patient not taking: Reported on 06/28/2016 07/17/15   Myeong O Sheard, DPM  predniSONE (DELTASONE) 10 MG tablet Take 6 tablets (60 mg total) by mouth daily. 06/28/16   Ankit Nanavati, MD   BP 131/82 mmHg  Pulse 40  Temp(Src) 98.4 F (36.9 C) (Oral)  Resp 16  Ht 5\' 10"  (1.778 m)  Wt 180 lb (81.647 kg)  BMI 25.83 kg/m2  SpO2 100%   Physical Exam CONSTITUTIONAL: Well developed/well nourished HEAD: Normocephalic/atraumatic EYES: EOMI/PERRL ENMT: Mucous membranes moist NECK: supple no meningeal signs SPINE/BACK:entire spine nontender CV: S1/S2 noted, no murmurs/rubs/gallops noted, Bradycardia noted LUNGS: Lungs are clear to auscultation bilaterally, no apparent distress ABDOMEN: soft, nontender, no rebound or guarding, bowel sounds noted throughout abdomen GU:no cva tenderness NEURO: Pt is awake/alert/appropriate, moves all extremitiesx4.  No facial droop.   EXTREMITIES: pulses normal/equal, full ROM, no edema or calf tenderness SKIN: warm, color normal PSYCH: no abnormalities of mood noted, alert and oriented to situation  ED Course  Procedures   DIAGNOSTIC STUDIES: Oxygen Saturation is 100% on RA, normal by my interpretation.   COORDINATION OF CARE: 2:24 AM-Discussed next steps with pt. Pt verbalized understanding and is agreeable with the plan.   Pt with repeat visit for chest pain He reports his CP actually improves with exercising However while at rest he doesn't feel well He thinks it is bronchitis Due to repeat ER visits, I expanded evaluation His d-dimer was elevated but CT chest negative for PE However, ?changes from smoking or early right HF He has no hypoxia No syncope and he denies dizziness with exercising He is bradycardic but pt reports this is chronic and may be due to his  extensive workout history I advised him to stop smoking Marijuana Also advised to stop testosterone supplements He is to avoid exercise until he is seen by cardiology.  I feel he is appropriate for outpatient management/echocardiogram   Labs Review Labs Reviewed  BASIC METABOLIC PANEL - Abnormal; Notable for the following:    Potassium 3.4 (*)    All other components within normal limits  CBC - Abnormal; Notable for the following:    RBC 3.86 (*)    Hemoglobin 12.2 (*)    HCT 37.7 (*)    All other components within normal limits  I-STAT TROPOININ, ED    Imaging Review Dg Chest 2 View  07/01/2016  CLINICAL DATA:  Chest tightness and shortness of breath for 4 days. EXAM: CHEST  2 VIEW COMPARISON:  PA and lateral chest 06/28/2016 and 09/05/2014. FINDINGS: The lungs are clear. Heart size is normal. There is no pneumothorax or pleural effusion. No focal bony abnormality. Thoracic spondylosis noted. IMPRESSION: No acute disease. Electronically Signed   By: Drusilla Kannerhomas  Dalessio M.D.   On: 07/01/2016 21:26   Ct Angio Chest Pe W/cm &/  or Wo Cm  07/02/2016  CLINICAL DATA:  Chest pain. EXAM: CT ANGIOGRAPHY CHEST WITH CONTRAST TECHNIQUE: Multidetector CT imaging of the chest was performed using the standard protocol during bolus administration of intravenous contrast. Multiplanar CT image reconstructions and MIPs were obtained to evaluate the vascular anatomy. CONTRAST:  100 cc Isovue 370 intravenous. COMPARISON:  None. FINDINGS: Cardiovascular: Negative for pulmonary embolism. No cardiomegaly or pericardial effusion. Reflux of contrast into the distended IVC. No left heart or systemic opacification. Negative noncontrast appearance of the aorta. Mediastinum:  Negative for adenopathy. Lungs/Pleura: Trace pleural effusions. Diffuse airway thickening which could be congestive or related to patient's smoking history. Few Kerley lines best seen on reformats. No consolidation or pneumothorax. Upper abdomen:  Perihepatic ascites and probable periportal edema. Musculoskeletal: No chest wall mass or suspicious bone lesions identified. Review of the MIP images confirms the above findings. IMPRESSION: 1. Negative for pulmonary embolism. 2. Constellation of findings seen with right heart failure. Correlate with echocardiography. These findings include periportal edema and small perihepatic ascites - correlate with liver function tests. 3. Trace effusions and early interstitial edema. Electronically Signed   By: Marnee Spring M.D.   On: 07/02/2016 04:13   I have personally reviewed and evaluated these images and lab results as part of my medical decision-making.   EKG Interpretation   Date/Time:  Monday July 01 2016 20:34:47 EDT Ventricular Rate:  42 PR Interval:  142 QRS Duration: 76 QT Interval:  462 QTC Calculation: 385 R Axis:   52 Text Interpretation:  Marked sinus bradycardia Abnormal ECG Confirmed by  Bebe Shaggy  MD, Dorinda Hill ([id number]) on 07/02/2016 2:20:23 AM      MDM   Final diagnoses:  Chest pain, unspecified chest pain type    Nursing notes including past medical history and social history reviewed and considered in documentation xrays/imaging reviewed by myself and considered during evaluation Labs/vital reviewed myself and considered during evaluation   I personally performed the services described in this documentation, which was scribed in my presence. The recorded information has been reviewed and is accurate.        Zadie Rhine, MD 07/02/16 [phone]

## 2016-07-02 NOTE — ED Notes (Signed)
Pts name called for vital sign reassessment - no answer.  

## 2016-07-02 NOTE — Discharge Instructions (Signed)
PLEASE LIMIT YOUR RUNNING AND EXERCISE UNTIL SEEN BY HEART SPECIALIST PLEASE STOP ALL TESTOSTERONE/EXERCISE SUPPLEMENTS PLEASE STOP SMOKING   Your caregiver has diagnosed you as having chest pain that is not specific for one problem, but does not require admission.  Chest pain comes from many different causes.  SEEK IMMEDIATE MEDICAL ATTENTION IF: You have severe chest pain, especially if the pain is crushing or pressure-like and spreads to the arms, back, neck, or jaw, or if you have sweating, nausea (feeling sick to your stomach), or shortness of breath. THIS IS AN EMERGENCY. Don't wait to see if the pain will go away. Get medical help at once. Call 911 or 0 (operator). DO NOT drive yourself to the hospital.  Your chest pain gets worse and does not go away with rest.  You have an attack of chest pain lasting longer than usual, despite rest and treatment with the medications your caregiver has prescribed.  You wake from sleep with chest pain or shortness of breath.  You feel dizzy or faint.  You have chest pain not typical of your usual pain for which you originally saw your caregiver.

## 2016-07-02 NOTE — ED Notes (Signed)
Pt understood dc material. NAD nooted

## 2016-07-02 NOTE — ED Notes (Signed)
Patient transported to CT 

## 2016-07-03 ENCOUNTER — Emergency Department (HOSPITAL_COMMUNITY): Payer: Medicaid Other

## 2016-07-03 ENCOUNTER — Emergency Department (HOSPITAL_COMMUNITY)
Admission: EM | Admit: 2016-07-03 | Discharge: 2016-07-03 | Disposition: A | Discharge status: Home / Self Care | Payer: Medicaid Other | Attending: Emergency Medicine | Admitting: Emergency Medicine

## 2016-07-03 ENCOUNTER — Encounter (HOSPITAL_COMMUNITY): Payer: Self-pay

## 2016-07-03 DIAGNOSIS — F17200 Nicotine dependence, unspecified, uncomplicated: Secondary | ICD-10-CM | POA: Insufficient documentation

## 2016-07-03 DIAGNOSIS — R1013 Epigastric pain: Secondary | ICD-10-CM | POA: Insufficient documentation

## 2016-07-03 DIAGNOSIS — R0600 Dyspnea, unspecified: Secondary | ICD-10-CM | POA: Diagnosis not present

## 2016-07-03 DIAGNOSIS — R079 Chest pain, unspecified: Secondary | ICD-10-CM

## 2016-07-03 DIAGNOSIS — R0789 Other chest pain: Secondary | ICD-10-CM | POA: Diagnosis not present

## 2016-07-03 LAB — CBC
HEMATOCRIT: 40.5 % (ref 39.0–52.0)
HEMOGLOBIN: 13.4 g/dL (ref 13.0–17.0)
MCH: 32.1 pg (ref 26.0–34.0)
MCHC: 33.1 g/dL (ref 30.0–36.0)
MCV: 96.9 fL (ref 78.0–100.0)
PLATELETS: 165 10*3/uL (ref 150–400)
RBC: 4.18 MIL/uL — ABNORMAL LOW (ref 4.22–5.81)
RDW: 12.4 % (ref 11.5–15.5)
WBC: 5.6 10*3/uL (ref 4.0–10.5)

## 2016-07-03 LAB — HEPATIC FUNCTION PANEL
ALBUMIN: 3.6 g/dL (ref 3.5–5.0)
ALT: 196 U/L — ABNORMAL HIGH (ref 17–63)
AST: 59 U/L — ABNORMAL HIGH (ref 15–41)
Alkaline Phosphatase: 95 U/L (ref 38–126)
BILIRUBIN TOTAL: 0.4 mg/dL (ref 0.3–1.2)
Bilirubin, Direct: 0.1 mg/dL (ref 0.1–0.5)
Indirect Bilirubin: 0.3 mg/dL (ref 0.3–0.9)
TOTAL PROTEIN: 6.2 g/dL — AB (ref 6.5–8.1)

## 2016-07-03 LAB — BASIC METABOLIC PANEL
ANION GAP: 4 — AB (ref 5–15)
BUN: 13 mg/dL (ref 6–20)
CO2: 31 mmol/L (ref 22–32)
Calcium: 8.7 mg/dL — ABNORMAL LOW (ref 8.9–10.3)
Chloride: 104 mmol/L (ref 101–111)
Creatinine, Ser: 0.83 mg/dL (ref 0.61–1.24)
Glucose, Bld: 83 mg/dL (ref 65–99)
POTASSIUM: 3.7 mmol/L (ref 3.5–5.1)
SODIUM: 139 mmol/L (ref 135–145)

## 2016-07-03 LAB — I-STAT TROPONIN, ED
TROPONIN I, POC: 0.01 ng/mL (ref 0.00–0.08)
Troponin i, poc: 0.01 ng/mL (ref 0.00–0.08)

## 2016-07-03 LAB — LIPASE, BLOOD: LIPASE: 177 U/L — AB (ref 11–51)

## 2016-07-03 MED ORDER — GI COCKTAIL ~~LOC~~
30.0000 mL | Freq: Once | ORAL | Status: AC
  Administered 2016-07-03: 30 mL via ORAL
  Filled 2016-07-03: qty 30

## 2016-07-03 MED ORDER — OMEPRAZOLE 20 MG PO CPDR: 20.0000 mg | DELAYED_RELEASE_CAPSULE | Freq: Every day | ORAL | Status: DC

## 2016-07-03 MED ORDER — SUCRALFATE 1 G PO TABS: 1.0000 g | ORAL_TABLET | Freq: Three times a day (TID) | ORAL | Status: DC

## 2016-07-03 MED ORDER — ALBUTEROL SULFATE (2.5 MG/3ML) 0.083% IN NEBU
2.5000 mg | INHALATION_SOLUTION | Freq: Once | RESPIRATORY_TRACT | Status: AC
  Administered 2016-07-03: 2.5 mg via RESPIRATORY_TRACT
  Filled 2016-07-03: qty 3

## 2016-07-03 NOTE — ED Notes (Signed)
Pt. C/O of chest pain since Thursday. Pt. sts he has been here two times since then and the pain is continuing to increase and he is having new onset abd discomfort and distention with bloating and constipation. Pt. sts that exercise  Relieves his chest pain and he is an every day runner.

## 2016-07-03 NOTE — ED Notes (Signed)
Placed patient into a grown and on the monitor

## 2016-07-03 NOTE — ED Notes (Signed)
Patient transported to X-ray 
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
## 2016-07-03 NOTE — Discharge Instructions (Signed)

## 2016-07-05 ENCOUNTER — Encounter (INDEPENDENT_AMBULATORY_CARE_PROVIDER_SITE_OTHER): Payer: Self-pay

## 2016-07-05 ENCOUNTER — Ambulatory Visit (INDEPENDENT_AMBULATORY_CARE_PROVIDER_SITE_OTHER): Payer: Medicaid Other | Admitting: Cardiology

## 2016-07-05 ENCOUNTER — Encounter: Payer: Self-pay | Admitting: Cardiology

## 2016-07-05 VITALS — BP 128/70 | HR 46 | Ht 70.0 in | Wt 180.0 lb

## 2016-07-05 DIAGNOSIS — R740 Nonspecific elevation of levels of transaminase and lactic acid dehydrogenase [LDH]: Secondary | ICD-10-CM | POA: Diagnosis not present

## 2016-07-05 DIAGNOSIS — R748 Abnormal levels of other serum enzymes: Secondary | ICD-10-CM

## 2016-07-05 DIAGNOSIS — R072 Precordial pain: Secondary | ICD-10-CM | POA: Diagnosis not present

## 2016-07-05 DIAGNOSIS — I319 Disease of pericardium, unspecified: Secondary | ICD-10-CM | POA: Diagnosis not present

## 2016-07-05 DIAGNOSIS — R7401 Elevation of levels of liver transaminase levels: Secondary | ICD-10-CM

## 2016-07-05 MED ORDER — COLCHICINE 0.6 MG PO TABS: 0.6000 mg | ORAL_TABLET | Freq: Two times a day (BID) | ORAL | Status: DC
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
## 2016-07-05 NOTE — Patient Instructions (Addendum)
Medication Instructions:  1. START COLCHICINE 0.6 MG TWICE DAILY; YOU WILL DO THIS FOR 3 MONTHS   2. IBUPROFEN 400 MG TWICE DAILY FOR 2 WEEKS  Labwork: NONE  Testing/Procedures: ABDOMINAL ULTRASOUND TO BE DONE DUE TO ELEVATED ALT, AST, LIPASE  Follow-Up: PER DR. NELSON YOU WILL NEED AN APPT NEXT Friday 07/12/16 WITH ONE OF THE NP/PA'S  Any Other Special Instructions Will Be Listed Below (If Applicable).     If you need a refill on your cardiac medications before your next appointment, please call your pharmacy.

## 2016-07-12 ENCOUNTER — Ambulatory Visit (HOSPITAL_COMMUNITY): Payer: Medicaid Other
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
## 2016-07-17 ENCOUNTER — Ambulatory Visit: Payer: Medicaid Other | Admitting: Cardiology

## 2016-07-19 ENCOUNTER — Ambulatory Visit (HOSPITAL_COMMUNITY)
Admission: RE | Admit: 2016-07-19 | Discharge: 2016-07-19 | Disposition: A | Discharge status: Home / Self Care | Payer: Medicaid Other | Source: Ambulatory Visit | Attending: Cardiology | Admitting: Cardiology

## 2016-07-19 ENCOUNTER — Encounter: Payer: Self-pay | Admitting: Nurse Practitioner

## 2016-07-19 ENCOUNTER — Ambulatory Visit (INDEPENDENT_AMBULATORY_CARE_PROVIDER_SITE_OTHER): Payer: Medicaid Other | Admitting: Nurse Practitioner

## 2016-07-19 ENCOUNTER — Encounter: Payer: Self-pay | Admitting: Gastroenterology

## 2016-07-19 VITALS — BP 120/72 | HR 52 | Ht 70.0 in | Wt 182.0 lb

## 2016-07-19 DIAGNOSIS — R0789 Other chest pain: Secondary | ICD-10-CM | POA: Diagnosis not present

## 2016-07-19 DIAGNOSIS — R748 Abnormal levels of other serum enzymes: Secondary | ICD-10-CM | POA: Insufficient documentation

## 2016-07-19 DIAGNOSIS — R109 Unspecified abdominal pain: Secondary | ICD-10-CM | POA: Diagnosis not present

## 2016-07-19 DIAGNOSIS — R7401 Elevation of levels of liver transaminase levels: Secondary | ICD-10-CM

## 2016-07-19 DIAGNOSIS — N281 Cyst of kidney, acquired: Secondary | ICD-10-CM | POA: Insufficient documentation

## 2016-07-19 DIAGNOSIS — R740 Nonspecific elevation of levels of transaminase and lactic acid dehydrogenase [LDH]: Secondary | ICD-10-CM | POA: Insufficient documentation

## 2016-07-19 DIAGNOSIS — R0602 Shortness of breath: Secondary | ICD-10-CM

## 2016-07-19 LAB — BASIC METABOLIC PANEL
BUN: 10 mg/dL (ref 7–25)
CO2: 30 mmol/L (ref 20–31)
Calcium: 9.1 mg/dL (ref 8.6–10.3)
Chloride: 103 mmol/L (ref 98–110)
Creat: 0.94 mg/dL (ref 0.60–1.35)
Glucose, Bld: 74 mg/dL (ref 65–99)
Potassium: 3.9 mmol/L (ref 3.5–5.3)
Sodium: 141 mmol/L (ref 135–146)

## 2016-07-19 LAB — HEPATIC FUNCTION PANEL
ALT: 27 U/L (ref 9–46)
AST: 23 U/L (ref 10–40)
Albumin: 4.3 g/dL (ref 3.6–5.1)
Alkaline Phosphatase: 92 U/L (ref 40–115)
Bilirubin, Direct: 0.1 mg/dL (ref <=0.2)
Indirect Bilirubin: 0.4 mg/dL (ref 0.2–1.2)
Total Bilirubin: 0.5 mg/dL (ref 0.2–1.2)
Total Protein: 6.5 g/dL (ref 6.1–8.1)

## 2016-07-19 LAB — CBC
HCT: 39.6 % (ref 38.5–50.0)
Hemoglobin: 13.5 g/dL (ref 13.2–17.1)
MCH: 32.7 pg (ref 27.0–33.0)
MCHC: 34.1 g/dL (ref 32.0–36.0)
MCV: 95.9 fL (ref 80.0–100.0)
MPV: 10.5 fL (ref 7.5–12.5)
Platelets: 183 10*3/uL (ref 140–400)
RBC: 4.13 MIL/uL — ABNORMAL LOW (ref 4.20–5.80)
RDW: 13 % (ref 11.0–15.0)
WBC: 4.8 10*3/uL (ref 3.8–10.8)

## 2016-07-19 LAB — BRAIN NATRIURETIC PEPTIDE: Brain Natriuretic Peptide: <4 pg/mL (ref <100)

## 2016-07-19 LAB — TSH: TSH: 0.33 mIU/L — ABNORMAL LOW (ref 0.40–4.50)

## 2016-07-19 NOTE — Patient Instructions (Addendum)
We will be checking the following labs today - BNP, LFTs, TSH, CBC and BMET   Medication Instructions:    Continue with your current medicines.     Testing/Procedures To Be Arranged:  Echocardiogram  Follow-Up:   See Dr. Tera Mater Kriste Basque, Marcelino Duster) in   Needs referral to GI for abnormal abd ultrasound   Other Special Instructions:   N/A    If you need a refill on your cardiac medications before your next appointment, please call your pharmacy.   Call the [facility] office at [phone] if you have any questions, problems or concerns.
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
## 2016-07-22 ENCOUNTER — Telehealth: Payer: Self-pay | Admitting: *Deleted

## 2016-07-22 ENCOUNTER — Other Ambulatory Visit: Payer: Self-pay | Admitting: *Deleted

## 2016-07-22 DIAGNOSIS — E0590 Thyrotoxicosis, unspecified without thyrotoxic crisis or storm: Secondary | ICD-10-CM

## 2016-07-22 DIAGNOSIS — R935 Abnormal findings on diagnostic imaging of other abdominal regions, including retroperitoneum: Secondary | ICD-10-CM

## 2016-07-22 NOTE — Telephone Encounter (Signed)
-----   Message from Lars MassonKatarina H Nelson, MD sent at 07/22/2016  7:31 AM EDT ----- No evidence of an acute cholecystitis, but the gallbladder is mildly distended. I would refer him to GI for further workup.

## 2016-07-22 NOTE — Telephone Encounter (Signed)
Notified the pt that per Dr Delton SeeNelson, his abdominal US showed no evidence of an acute cholecystitis, but the gallbladder is mildly distended.  Informed the pt that Dr Delton SeeNelson recommends that we refer him to GI for further workup. Informed the pt that I will place the referral in the system, and [facility] a [facility] to call him back and arrange this referral.  Pt verbalized understanding and agrees with this plan.

## 2016-07-23 ENCOUNTER — Ambulatory Visit: Payer: Medicaid Other | Admitting: Gastroenterology

## 2016-07-23 ENCOUNTER — Other Ambulatory Visit: Payer: Self-pay

## 2016-07-29 ENCOUNTER — Other Ambulatory Visit (INDEPENDENT_AMBULATORY_CARE_PROVIDER_SITE_OTHER): Payer: Medicaid Other

## 2016-07-29 ENCOUNTER — Encounter (INDEPENDENT_AMBULATORY_CARE_PROVIDER_SITE_OTHER): Payer: Self-pay

## 2016-07-29 ENCOUNTER — Ambulatory Visit (INDEPENDENT_AMBULATORY_CARE_PROVIDER_SITE_OTHER): Payer: Medicaid Other | Admitting: Gastroenterology

## 2016-07-29 ENCOUNTER — Encounter: Payer: Self-pay | Admitting: Gastroenterology

## 2016-07-29 VITALS — BP 110/74 | HR 68 | Ht 69.29 in | Wt 185.0 lb

## 2016-07-29 DIAGNOSIS — R7989 Other specified abnormal findings of blood chemistry: Secondary | ICD-10-CM

## 2016-07-29 DIAGNOSIS — R140 Abdominal distension (gaseous): Secondary | ICD-10-CM

## 2016-07-29 DIAGNOSIS — R748 Abnormal levels of other serum enzymes: Secondary | ICD-10-CM

## 2016-07-29 DIAGNOSIS — K5909 Other constipation: Secondary | ICD-10-CM | POA: Diagnosis not present

## 2016-07-29 DIAGNOSIS — Z1211 Encounter for screening for malignant neoplasm of colon: Secondary | ICD-10-CM | POA: Diagnosis not present

## 2016-07-29 DIAGNOSIS — K5900 Constipation, unspecified: Secondary | ICD-10-CM | POA: Insufficient documentation

## 2016-07-29 DIAGNOSIS — R945 Abnormal results of liver function studies: Secondary | ICD-10-CM

## 2016-07-29 LAB — LIPASE: Lipase: 107 U/L — ABNORMAL HIGH (ref 11.0–59.0)

## 2016-07-29 LAB — HEPATIC FUNCTION PANEL
ALT: 38 U/L (ref 0–53)
AST: 32 U/L (ref 0–37)
Albumin: 4.3 g/dL (ref 3.5–5.2)
Alkaline Phosphatase: 91 U/L (ref 39–117)
BILIRUBIN DIRECT: 0.1 mg/dL (ref 0.0–0.3)
BILIRUBIN TOTAL: 0.4 mg/dL (ref 0.2–1.2)
Total Protein: 6.6 g/dL (ref 6.0–8.3)

## 2016-07-29 MED ORDER — NA SULFATE-K SULFATE-MG SULF 17.5-3.13-1.6 GM/177ML PO SOLN: 1.0000 | Freq: Once | ORAL | 0 refills | Status: AC

## 2016-07-29 MED ORDER — LINACLOTIDE 145 MCG PO CAPS: 145.0000 ug | ORAL_CAPSULE | Freq: Every day | ORAL | 2 refills | Status: DC

## 2016-07-29 NOTE — Patient Instructions (Signed)
Please go to the basement level to have your labs drawn.  [facility] [address]  1. Suprep - colonoscpy prep 2. Linzess 145 mcg.   You have been scheduled for a colonoscopy. Please follow written instructions given to you at your visit today.  Please pick up your prep supplies at the pharmacy within the next 1-3 days. If you use inhalers (even only as needed), please bring them with you on the day of your procedure. Your physician has requested that you go to www.[email or web address].com and enter the access code given to you at your visit today. This web site gives a general overview about your procedure. However, you should still follow specific instructions given to you by our office regarding your preparation for the procedure.

## 2016-07-29 NOTE — Progress Notes (Signed)
07/29/2016 Clinton Allen [id number] 1971/08/31   HISTORY OF PRESENT ILLNESS:  This is a 45 year old male who is new to our practice and was referred here by Dr. Meda Coffee at [facility] for evaluation of elevated LFT's.  3 weeks ago his AST was elevated to 59 and AST was elevated to 196 with other numbers within normal limits. They were rechecked just 10 days ago and AST is down to 23 and ALT is down to 27. Ultrasound was performed and showed a mildly distended gallbladder with bile versus minimal sludge observed without evidence of stones or acute cholecystitis. The liver appeared normal. Pancreatic duct was top normal at 3 mm.  Denies ETOH use.  Of note, lipase was elevated at 177.  He does complain of constipation saying that MiraLAX daily does not help much. He complains of always feeling bloated and full. Complains of some left sided flank pain and possibly some left upper quadrant abdominal pain. He has never had colonoscopy in the past.  Denies rectal bleeding.  No heartburn/indigestion/reflux.  Past Medical History:  Diagnosis Date  . Bipolar 1 disorder ([place])   . Bronchitis   . Neck fracture ([place])   . Vitamin D deficiency    Past Surgical History:  Procedure Laterality Date  . Cotton Osteotomy w/ Bone Graft Left 04/27/2015  . Cotton Osteotomy w/ Bone Graft Right 06/29/2015  . Hammer Toe Repair Left 04/27/2015   Lt #2  . NECK SURGERY    . TOE SURGERY Bilateral     reports that he quit smoking about 22 years ago. He has never used smokeless tobacco. He reports that he uses drugs, including Marijuana. His alcohol history is not on file. family history includes Heart attack in his father; Hyperkalemia in his mother; Hyperlipidemia in his mother; Hypertension in his mother; Lung cancer in his maternal uncle. Allergies  Allergen Reactions  . Eggs Or Egg-Derived Products Anaphylaxis      Outpatient Encounter Prescriptions as of 07/29/2016  Medication Sig  . albuterol  (PROVENTIL HFA;VENTOLIN HFA) 108 (90 Base) MCG/ACT inhaler Inhale 2 puffs into the lungs every 6 (six) hours as needed for wheezing or shortness of breath.  . AMBULATORY NON FORMULARY MEDICATION Testosterone Booster I tab by mouth every day  . AMBULATORY NON FORMULARY MEDICATION Pre-workout [place] 1 tab by mouth every day  . colchicine 0.6 MG tablet Take 1 tablet (0.6 mg total) by mouth 2 (two) times daily.  Marland Kitchen HYDROcodone-acetaminophen (NORCO/VICODIN) 5-325 MG tablet Take 1 tablet by mouth every 6 (six) hours as needed for moderate pain.  Marland Kitchen loratadine (CLARITIN) 10 MG tablet Take 1 tablet by mouth daily as needed.  Marland Kitchen PATADAY 0.2 % SOLN Place 1 drop into both eyes daily as needed.  . polyethylene glycol powder (GLYCOLAX/MIRALAX) powder MIX 1 TABLESPOONFUL IN 8 OUNCES OF WATER/JUICE EVERY DAY AS NEEDED  . triamcinolone cream (KENALOG) 0.1 % Apply 1 application topically 2 (two) times daily.  . Vitamin D, Ergocalciferol, (DRISDOL) 50000 units CAPS capsule Take 50,000 Units by mouth once a week.  . linaclotide (LINZESS) 145 MCG CAPS capsule Take 1 capsule (145 mcg total) by mouth daily before breakfast.  . Na Sulfate-K Sulfate-Mg Sulf 17.5-3.13-1.6 GM/180ML SOLN Take 1 kit by mouth once.  . sildenafil (VIAGRA) 100 MG tablet Take 100 mg by mouth daily as needed for erectile dysfunction.  . [DISCONTINUED] lidocaine (XYLOCAINE) 5 % ointment Apply 1 application topically 3 (three) times daily as needed.  . [DISCONTINUED] MULTIPLE VITAMIN  PO Take 1 tablet by mouth daily.   No facility-administered encounter medications on file as of 07/29/2016.      REVIEW OF SYSTEMS  : All other systems reviewed and negative except where noted in the History of Present Illness.   PHYSICAL EXAM: BP 110/74 (BP Location: Left Arm, Patient Position: Sitting, Cuff Size: Normal)   Pulse 68   Ht 5' 9.29" (1.76 m) Comment: height measured without shoes  Wt 185 lb (83.9 kg)   BMI 27.09 kg/m  General: Well developed  black male in no acute distress Head: Normocephalic and atraumatic Eyes:  Sclerae anicteric, conjunctiva pink. Ears: Normal auditory acuity Lungs: Clear throughout to auscultation Heart: Regular rate and rhythm Abdomen: Soft, non-distended.  BS present.  Non-tender. Rectal:  Will be done at the time of colonoscopy. Musculoskeletal: Symmetrical with no gross deformities  Skin: No lesions on visible extremities Extremities: No edema  Neurological: Alert oriented x 4, grossly non-focal Psychological:  Alert and cooperative. Normal mood and affect  ASSESSMENT AND PLAN: -Screening colonoscopy:  Will schedule with Dr. Fuller Plan.  The risks, benefits, and alternatives to colonoscopy were discussed with the patient and he consents to proceed.  -Constipation and bloating:  Will try Linzess 145 mcg daily in place of Miralax to see if that helps. -Elevated LFT's:  Only elevated on one occasion and now they have returned to normal.  Ultrasound shows normal liver and gallbladder with sludge.  Symptoms do not necessarily sound gallbladder related at this point. -Elevated lipase:  Only one reading with elevation to 177.  Will recheck that today.  If still elevated then may need CT scan to evaluate further.  Ultrasound showed upper limits of normal PD at 3 mm.  CC:  Nolene Ebbs, MD

## 2016-07-29 NOTE — Progress Notes (Signed)
Reviewed and agree with initial management plan.  Yasmen Cortner T. Demtrius Rounds, MD FACG 

## 2016-07-30 ENCOUNTER — Ambulatory Visit (HOSPITAL_COMMUNITY): Payer: Medicaid Other | Attending: Cardiovascular Disease

## 2016-07-30 ENCOUNTER — Other Ambulatory Visit (INDEPENDENT_AMBULATORY_CARE_PROVIDER_SITE_OTHER): Payer: Medicaid Other | Admitting: *Deleted

## 2016-07-30 ENCOUNTER — Other Ambulatory Visit: Payer: Self-pay

## 2016-07-30 ENCOUNTER — Encounter (INDEPENDENT_AMBULATORY_CARE_PROVIDER_SITE_OTHER): Payer: Self-pay

## 2016-07-30 DIAGNOSIS — E0590 Thyrotoxicosis, unspecified without thyrotoxic crisis or storm: Secondary | ICD-10-CM

## 2016-07-30 DIAGNOSIS — R748 Abnormal levels of other serum enzymes: Secondary | ICD-10-CM | POA: Insufficient documentation

## 2016-07-30 DIAGNOSIS — I340 Nonrheumatic mitral (valve) insufficiency: Secondary | ICD-10-CM | POA: Insufficient documentation

## 2016-07-30 DIAGNOSIS — R740 Nonspecific elevation of levels of transaminase and lactic acid dehydrogenase [LDH]: Secondary | ICD-10-CM | POA: Insufficient documentation

## 2016-07-30 DIAGNOSIS — R109 Unspecified abdominal pain: Secondary | ICD-10-CM | POA: Insufficient documentation

## 2016-07-30 DIAGNOSIS — I517 Cardiomegaly: Secondary | ICD-10-CM | POA: Insufficient documentation

## 2016-07-30 DIAGNOSIS — R079 Chest pain, unspecified: Secondary | ICD-10-CM | POA: Diagnosis present

## 2016-07-30 DIAGNOSIS — R0789 Other chest pain: Secondary | ICD-10-CM | POA: Diagnosis not present

## 2016-07-30 DIAGNOSIS — R7401 Elevation of levels of liver transaminase levels: Secondary | ICD-10-CM

## 2016-07-30 LAB — TSH: TSH: 0.8 mIU/L (ref 0.40–4.50)

## 2016-08-21 ENCOUNTER — Other Ambulatory Visit: Payer: Self-pay

## 2016-08-21 DIAGNOSIS — R748 Abnormal levels of other serum enzymes: Secondary | ICD-10-CM

## 2016-08-23 ENCOUNTER — Other Ambulatory Visit (INDEPENDENT_AMBULATORY_CARE_PROVIDER_SITE_OTHER): Payer: Medicaid Other

## 2016-08-23 DIAGNOSIS — R748 Abnormal levels of other serum enzymes: Secondary | ICD-10-CM

## 2016-08-23 LAB — LIPASE: Lipase: 82 U/L — ABNORMAL HIGH (ref 11.0–59.0)

## 2016-08-26 ENCOUNTER — Encounter: Payer: Self-pay | Admitting: Gastroenterology

## 2016-08-26 ENCOUNTER — Ambulatory Visit (AMBULATORY_SURGERY_CENTER): Payer: Medicaid Other | Admitting: Gastroenterology

## 2016-08-26 VITALS — BP 108/62 | HR 48 | Temp 98.4°F | Resp 11 | Ht 69.0 in | Wt 185.0 lb

## 2016-08-26 DIAGNOSIS — Z1211 Encounter for screening for malignant neoplasm of colon: Secondary | ICD-10-CM

## 2016-08-26 MED ORDER — SODIUM CHLORIDE 0.9 % IV SOLN: 500.0000 mL | INTRAVENOUS | Status: DC

## 2016-08-26 NOTE — Progress Notes (Signed)
To PACU,awake and responsive, Report to RN

## 2016-08-26 NOTE — Op Note (Signed)
[facility] Patient Name: Clinton Allen Procedure Date: 08/26/2016 3:20 PM MRN: [id number] Endoscopist: Meryl Dare , MD Age: 45 Referring MD:  Date of Birth: 06/06/1971 Gender: Male Account #: [id number] Procedure:                Colonoscopy Indications:              Screening for colorectal malignant neoplasm Medicines:                Fentanyl 100 micrograms IV, Midazolam 10 mg IV Procedure:                Pre-Anesthesia Assessment:                           - Prior to the procedure, a History and Physical                            was performed, and patient medications and                            allergies were reviewed. The patient's tolerance of                            previous anesthesia was also reviewed. The risks                            and benefits of the procedure and the sedation                            options and risks were discussed with the patient.                            All questions were answered, and informed consent                            was obtained. Prior Anticoagulants: The patient has                            taken no previous anticoagulant or antiplatelet                            agents. ASA Grade Assessment: II - A patient with                            mild systemic disease. After reviewing the risks                            and benefits, the patient was deemed in                            satisfactory condition to undergo the procedure.                           After obtaining informed consent, the colonoscope  was passed under direct vision. Throughout the                            procedure, the patient's blood pressure, pulse, and                            oxygen saturations were monitored continuously. The                            Model PCF-[id number](SN#[id number]) scope was introduced                            through the anus and advanced to the the cecum,        identified by appendiceal orifice and ileocecal                            valve. The ileocecal valve, appendiceal orifice,                            and rectum were photographed. The quality of the                            bowel preparation was excellent. The colonoscopy                            was performed without difficulty. The patient                            tolerated the procedure well. Scope In: 3:39:03 PM Scope Out: 3:53:02 PM Scope Withdrawal Time: 0 hours 10 minutes 51 seconds  Total Procedure Duration: 0 hours 13 minutes 59 seconds  Findings:                 Internal hemorrhoids were found during                            retroflexion. The hemorrhoids were small and Grade                            I (internal hemorrhoids that do not prolapse).                           The exam was otherwise without abnormality on                            direct and retroflexion views. Complications:            No immediate complications. Estimated blood loss:                            None. Estimated Blood Loss:     Estimated blood loss: none. Impression:               - Internal hemorrhoids.                           -  The examination was otherwise normal on direct                            and retroflexion views.                           - No specimens collected. Recommendation:           - Repeat colonoscopy in 10 years for screening                            purposes.                           - Patient has a contact number available for                            emergencies. The signs and symptoms of potential                            delayed complications were discussed with the                            patient. Return to normal activities tomorrow.                            Written discharge instructions were provided to the                            patient.                           - Resume previous diet.                           - Continue present  medications. Meryl Dare, MD 08/26/2016 3:59:49 PM This report has been signed electronically.

## 2016-08-26 NOTE — Patient Instructions (Signed)
YOU HAD AN ENDOSCOPIC PROCEDURE TODAY AT THE [facility]:   Refer to the procedure report that was given to you for any specific questions about what was found during the examination.  If the procedure report does not answer your questions, please call your gastroenterologist to clarify.  If you requested that your care partner not be given the details of your procedure findings, then the procedure report has been included in a sealed envelope for you to review at your convenience later.  YOU SHOULD EXPECT: Some feelings of bloating in the abdomen. Passage of more gas than usual.  Walking can help get rid of the air that was put into your GI tract during the procedure and reduce the bloating. If you had a lower endoscopy (such as a colonoscopy or flexible sigmoidoscopy) you may notice spotting of blood in your stool or on the toilet paper. If you underwent a bowel prep for your procedure, you may not have a normal bowel movement for a few days.  Please Note:  You might notice some irritation and congestion in your nose or some drainage.  This is from the oxygen used during your procedure.  There is no need for concern and it should clear up in a day or so.  SYMPTOMS TO REPORT IMMEDIATELY:   Following lower endoscopy (colonoscopy or flexible sigmoidoscopy):  Excessive amounts of blood in the stool  Significant tenderness or worsening of abdominal pains  Swelling of the abdomen that is new, acute  Fever of 100F or higher    For urgent or emergent issues, a gastroenterologist can be reached at any hour by calling [phone].   DIET:  We do recommend a small meal at first, but then you may proceed to your regular diet.  Drink plenty of fluids but you should avoid alcoholic beverages for 24 hours.  ACTIVITY:  You should plan to take it easy for the rest of today and you should NOT DRIVE or use heavy machinery until tomorrow (because of the sedation medicines used during the test).     FOLLOW UP: Our staff will call the number listed on your records the next business day following your procedure to check on you and address any questions or concerns that you may have regarding the information given to you following your procedure. If we do not reach you, we will leave a message.  However, if you are feeling well and you are not experiencing any problems, there is no need to return our call.  We will assume that you have returned to your regular daily activities without incident.  If any biopsies were taken you will be contacted by phone or by letter within the next 1-3 weeks.  Please call us at [phone] if you have not heard about the biopsies in 3 weeks.    SIGNATURES/CONFIDENTIALITY: You and/or your care partner have signed paperwork which will be entered into your electronic medical record.  These signatures attest to the fact that that the information above on your After Visit Summary has been reviewed and is understood.  Full responsibility of the confidentiality of this discharge information lies with you and/or your care-partner.   Resume medications. Information given on hemorrhoids. 

## 2016-08-27 ENCOUNTER — Telehealth: Payer: Self-pay

## 2016-08-27 NOTE — Telephone Encounter (Signed)
  Follow up Call-  Call back number 08/26/2016  Post procedure Call Back phone  # [phone]  Permission to leave phone message Yes  Some recent data might be hidden     Patient questions:  Do you have a fever, pain , or abdominal swelling? No. Pain Score  0 *  Have you tolerated food without any problems? Yes.    Have you been able to return to your normal activities? Yes.    Do you have any questions about your discharge instructions: Diet   No. Medications  No. Follow up visit  No.  Do you have questions or concerns about your Care? No.  Actions: * If pain score is 4 or above: No action needed, pain <4.

## 2016-09-05 ENCOUNTER — Other Ambulatory Visit: Payer: Self-pay

## 2016-09-05 DIAGNOSIS — R1012 Left upper quadrant pain: Secondary | ICD-10-CM

## 2016-09-05 DIAGNOSIS — R748 Abnormal levels of other serum enzymes: Secondary | ICD-10-CM

## 2016-09-05 NOTE — Progress Notes (Signed)
Pt has been instructed and contrast left at the front desk.     You have been scheduled for a CT scan of the abdomen  at [place] ([address]---this is in the same building as Press photographer).   You are scheduled on 09/11/16 at 130 pm. You should arrive 15 minutes prior to your appointment time for registration. Please follow the written instructions below on the day of your exam:  WARNING: IF YOU ARE ALLERGIC TO IODINE/X-RAY DYE, PLEASE NOTIFY RADIOLOGY IMMEDIATELY AT [phone]! YOU WILL BE GIVEN A 13 HOUR PREMEDICATION PREP.  1) Do not eat or drink anything after 930 am (4 hours prior to your test) 2) You have been given 2 bottles of oral contrast to drink. The solution may taste better if refrigerated, but do NOT add ice or any other liquid to this solution. Shake             well before drinking.    Drink 1 bottle of contrast @ 1130 am (2 hours prior to your exam)  Drink 1 bottle of contrast @ 1230 pm (1 hour prior to your exam)  You may take any medications as prescribed with a small amount of water except for the following: Metformin, Glucophage, Glucovance, Avandamet, Riomet, Fortamet, Actoplus Met, Janumet, Glumetza or Metaglip. The above medications must be held the day of the exam AND 48 hours after the exam.  The purpose of you drinking the oral contrast is to aid in the visualization of your intestinal tract. The contrast solution may cause some diarrhea. Before your exam is started, you will be given a small amount of fluid to drink. Depending on your individual set of symptoms, you may also receive an intravenous injection of x-ray contrast/dye. Plan on being at [facility] for 30 minutes or longer, depending on the type of exam you are having performed.  This test typically takes 30-45 minutes to complete.  If you have any questions regarding your exam or if you need to reschedule, you may call the CT department at [phone] between the hours  of 8:00 am and 5:00 pm, Monday-Friday.  ________________________________________________________________________

## 2016-09-06 ENCOUNTER — Encounter: Payer: Self-pay | Admitting: Cardiology

## 2016-09-10 ENCOUNTER — Telehealth: Payer: Self-pay | Admitting: Gastroenterology

## 2016-09-10 NOTE — Telephone Encounter (Signed)
A user error has taken place: ERROR °

## 2016-09-11 ENCOUNTER — Encounter (INDEPENDENT_AMBULATORY_CARE_PROVIDER_SITE_OTHER): Payer: Self-pay

## 2016-09-11 ENCOUNTER — Ambulatory Visit (INDEPENDENT_AMBULATORY_CARE_PROVIDER_SITE_OTHER)
Admission: RE | Admit: 2016-09-11 | Discharge: 2016-09-11 | Disposition: A | Discharge status: Home / Self Care | Payer: Medicaid Other | Source: Ambulatory Visit | Attending: Gastroenterology | Admitting: Gastroenterology

## 2016-09-11 DIAGNOSIS — R1012 Left upper quadrant pain: Secondary | ICD-10-CM

## 2016-09-11 DIAGNOSIS — R748 Abnormal levels of other serum enzymes: Secondary | ICD-10-CM

## 2016-09-11 MED ORDER — IOPAMIDOL (ISOVUE-300) INJECTION 61%: 100.0000 mL | Freq: Once | INTRAVENOUS | Status: DC | PRN

## 2016-09-23 ENCOUNTER — Emergency Department (HOSPITAL_COMMUNITY): Payer: Medicaid Other

## 2016-09-23 ENCOUNTER — Emergency Department (HOSPITAL_COMMUNITY)
Admission: EM | Admit: 2016-09-23 | Discharge: 2016-09-23 | Disposition: A | Discharge status: Home / Self Care | Payer: Medicaid Other | Attending: Physician Assistant | Admitting: Physician Assistant

## 2016-09-23 ENCOUNTER — Encounter (HOSPITAL_COMMUNITY): Payer: Self-pay | Admitting: *Deleted

## 2016-09-23 DIAGNOSIS — S99921A Unspecified injury of right foot, initial encounter: Secondary | ICD-10-CM | POA: Diagnosis present

## 2016-09-23 DIAGNOSIS — Y999 Unspecified external cause status: Secondary | ICD-10-CM | POA: Diagnosis not present

## 2016-09-23 DIAGNOSIS — Y9389 Activity, other specified: Secondary | ICD-10-CM | POA: Diagnosis not present

## 2016-09-23 DIAGNOSIS — Y9289 Other specified places as the place of occurrence of the external cause: Secondary | ICD-10-CM | POA: Diagnosis not present

## 2016-09-23 DIAGNOSIS — S92354A Nondisplaced fracture of fifth metatarsal bone, right foot, initial encounter for closed fracture: Secondary | ICD-10-CM

## 2016-09-23 DIAGNOSIS — Z87891 Personal history of nicotine dependence: Secondary | ICD-10-CM | POA: Insufficient documentation

## 2016-09-23 DIAGNOSIS — W1781XA Fall down embankment (hill), initial encounter: Secondary | ICD-10-CM | POA: Diagnosis not present

## 2016-09-23 MED ORDER — HYDROCODONE-ACETAMINOPHEN 5-325 MG PO TABS
1.0000 | ORAL_TABLET | Freq: Once | ORAL | Status: AC
  Administered 2016-09-23: 1 via ORAL
  Filled 2016-09-23: qty 1

## 2016-09-23 MED ORDER — IBUPROFEN 800 MG PO TABS: 800.0000 mg | ORAL_TABLET | Freq: Three times a day (TID) | ORAL | 0 refills | Status: DC

## 2016-09-23 MED ORDER — HYDROCODONE-ACETAMINOPHEN 5-325 MG PO TABS: 1.0000 | ORAL_TABLET | Freq: Four times a day (QID) | ORAL | 0 refills | Status: DC | PRN
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
## 2016-09-23 NOTE — Progress Notes (Signed)
Orthopedic Tech Progress Note Patient Details:  Cyril LoosenMichael S Scala Jan 31, 1971 [id number]  Ortho Devices Type of Ortho Device: Ace wrap, Post (short leg) splint, Crutches Ortho Device/Splint Location: rle Ortho Device/Splint Interventions: Application   Roey Coopman 09/23/2016, 9:37 AM
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
## 2016-09-23 NOTE — Discharge Instructions (Signed)
Ibuprofen as needed for mild to moderate pain. Pain medication as needed for severe pain - This can make you very drowsy - please do not drink alcohol, operate heavy machinery or drive on this medication.  Please stay nonweightbearing (do not put any pressure on your injured foot) until you are seen by either your podiatrist or the orthopedic clinic listed. You will need to call and schedule a follow-up appointment today to be seen this week. Return to the ER for new or worsening symptoms, any additional concerns.

## 2016-09-24 ENCOUNTER — Encounter: Payer: Self-pay | Admitting: Cardiology

## 2016-09-24 ENCOUNTER — Ambulatory Visit (INDEPENDENT_AMBULATORY_CARE_PROVIDER_SITE_OTHER): Payer: Medicaid Other | Admitting: Cardiology

## 2016-09-24 VITALS — BP 120/60 | HR 48 | Ht 71.0 in | Wt 189.0 lb

## 2016-09-24 DIAGNOSIS — R109 Unspecified abdominal pain: Secondary | ICD-10-CM | POA: Diagnosis not present

## 2016-09-24 DIAGNOSIS — I300 Acute nonspecific idiopathic pericarditis: Secondary | ICD-10-CM | POA: Diagnosis not present

## 2016-09-24 DIAGNOSIS — R748 Abnormal levels of other serum enzymes: Secondary | ICD-10-CM

## 2016-09-24 DIAGNOSIS — I308 Other forms of acute pericarditis: Secondary | ICD-10-CM
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
## 2016-09-24 NOTE — Patient Instructions (Signed)
Medication Instructions:   Your physician recommends that you continue on your current medications as directed. Please refer to the Current Medication list given to you today.    [facility]    Follow-Up:  Your physician wants you to follow-up in: ONE YEAR WITH DR Johnell ComingsNELSON You will receive a reminder letter in the mail two months in advance. If you don't receive a letter, please call our office to schedule the follow-up appointment.      If you need a refill on your cardiac medications before your next appointment, please call your pharmacy. '

## 2016-10-21 ENCOUNTER — Encounter: Payer: Self-pay | Admitting: Gastroenterology

## 2016-10-21 ENCOUNTER — Other Ambulatory Visit (INDEPENDENT_AMBULATORY_CARE_PROVIDER_SITE_OTHER): Payer: Medicaid Other

## 2016-10-21 ENCOUNTER — Ambulatory Visit (INDEPENDENT_AMBULATORY_CARE_PROVIDER_SITE_OTHER): Payer: Medicaid Other | Admitting: Gastroenterology

## 2016-10-21 ENCOUNTER — Encounter (INDEPENDENT_AMBULATORY_CARE_PROVIDER_SITE_OTHER): Payer: Self-pay

## 2016-10-21 VITALS — BP 100/70 | HR 60 | Ht 71.0 in | Wt 188.6 lb

## 2016-10-21 DIAGNOSIS — R935 Abnormal findings on diagnostic imaging of other abdominal regions, including retroperitoneum: Secondary | ICD-10-CM | POA: Diagnosis not present

## 2016-10-21 DIAGNOSIS — K5901 Slow transit constipation: Secondary | ICD-10-CM

## 2016-10-21 DIAGNOSIS — R748 Abnormal levels of other serum enzymes: Secondary | ICD-10-CM | POA: Diagnosis not present

## 2016-10-21 LAB — LIPASE: LIPASE: 226 U/L — AB (ref 11.0–59.0)

## 2016-10-21 NOTE — Progress Notes (Signed)
    History of Present Illness: This is a 45 year old male referred for follow up of abnormal LFTs, an elevated lipase and constipation. He was advised to return by his cardiologist due to a persistently elevated lipase. LFTs have returned to normal. Lipase has decreased from 177 to 82 without any typical symptoms of pancreatitis. Abdominal ultrasound and abdominal CT scan results below. Screening colonoscopy performed in September 2017 showed only internal hemorrhoids. He is having regular bowel movements on Linzess. He has no gastrointestinal complaints today.  IMPRESSION abdominal CT 09/11/2016: No CT findings of acute pancreatitis. Prominent dorsal drainage of the main pancreatic duct.  IMPRESSION abd US 07/19/2106: 1. The gallbladder is mildly distended. Echogenic bile versus minimal sludge is observed with out evidence of stones. There is no sonographic evidence of acute cholecystitis. If there are clinical concerns of gallbladder dysfunction, a nuclear medicine hepatobiliary scan may be useful. 2. Normal appearance of the liver. Limited visualization of the pancreatic tail. The pancreatic duct is top normal at 3 mm. 3. Simple appearing cysts in both kidneys.  No hydronephrosis.Small bilateral renal cysts measuring up to 9 mm.  No CT findings to account for the patient's upper abdominal pain. Current Medications, Allergies, Past Medical History, Past Surgical History, Family History and Social History were [facility] electronic [facility].  Physical Exam: General: Well developed, well nourished, no acute distress Head: Normocephalic and atraumatic Eyes:  sclerae anicteric, EOMI Ears: Normal auditory acuity Mouth: No deformity or lesions Lungs: Clear throughout to auscultation Heart: Regular rate and rhythm; no murmurs, rubs or bruits Abdomen: Soft, non tender and non distended. No masses, hepatosplenomegaly or hernias noted. Normal Bowel sounds Musculoskeletal:  Symmetrical with no gross deformities  Pulses:  Normal pulses noted Extremities: No clubbing, cyanosis, edema or deformities noted Neurological: Alert oriented x 4, grossly nonfocal Psychological:  Alert and cooperative. Normal mood and affect  Assessment and Recommendations:  1. Elevated lipase without pancreatitis or other pancreatic disease. Abdominal ultrasound and abdominal CT show only the pancreatic duct at the upper limit of normal and possible GB sludge. I do not feel that the elevated lipase is clinically relevant. If he has symptoms of pancreatitis he should return for further follow-up. Recheck lipase today however no further evaluation is recommended at this time.  2. Constipation. Continue Linzess 145 mcg daily. REV in 1 year.    3. Elevated LFTs have normalized. No clear etiology.

## 2016-10-21 NOTE — Patient Instructions (Signed)
Your physician has requested that you go to the basement for the following lab work before leaving today:Lipase.   Thank you for choosing me and [facility].  Venita LickMalcolm T. Pleas KochStark, Jr., MD., Clementeen GrahamFACG

## 2016-10-22 ENCOUNTER — Other Ambulatory Visit: Payer: Self-pay

## 2016-10-22 DIAGNOSIS — R748 Abnormal levels of other serum enzymes: Secondary | ICD-10-CM

## 2016-11-21 ENCOUNTER — Other Ambulatory Visit: Payer: Self-pay

## 2016-12-11 ENCOUNTER — Other Ambulatory Visit: Payer: Self-pay

## 2016-12-11 ENCOUNTER — Other Ambulatory Visit (INDEPENDENT_AMBULATORY_CARE_PROVIDER_SITE_OTHER): Payer: Medicaid Other

## 2016-12-11 DIAGNOSIS — R945 Abnormal results of liver function studies: Principal | ICD-10-CM

## 2016-12-11 DIAGNOSIS — R7989 Other specified abnormal findings of blood chemistry: Secondary | ICD-10-CM

## 2016-12-11 DIAGNOSIS — R748 Abnormal levels of other serum enzymes: Secondary | ICD-10-CM

## 2016-12-11 LAB — LIPASE: Lipase: 241 U/L — ABNORMAL HIGH (ref 11.0–59.0)

## 2016-12-11 LAB — AMYLASE: Amylase: 126 U/L (ref 27–131)

## 2016-12-14 LAB — AMYLASE ISOENZYMES
Amylase.: 134 U/L — ABNORMAL HIGH (ref 21–101)
ISOAMYLASE-PANCREATIC: 139 U/L — AB (ref 16–46)

## 2016-12-19 ENCOUNTER — Other Ambulatory Visit: Payer: Self-pay

## 2016-12-19 DIAGNOSIS — R748 Abnormal levels of other serum enzymes: Secondary | ICD-10-CM

## 2017-05-23 ENCOUNTER — Encounter (HOSPITAL_COMMUNITY): Payer: Self-pay | Admitting: Emergency Medicine

## 2017-05-23 ENCOUNTER — Emergency Department (HOSPITAL_COMMUNITY)
Admission: EM | Admit: 2017-05-23 | Discharge: 2017-05-23 | Disposition: A | Discharge status: Home / Self Care | Payer: Medicaid Other | Attending: Emergency Medicine | Admitting: Emergency Medicine

## 2017-05-23 DIAGNOSIS — R21 Rash and other nonspecific skin eruption: Secondary | ICD-10-CM | POA: Diagnosis present

## 2017-05-23 DIAGNOSIS — Z87891 Personal history of nicotine dependence: Secondary | ICD-10-CM | POA: Insufficient documentation

## 2017-05-23 DIAGNOSIS — L299 Pruritus, unspecified: Secondary | ICD-10-CM | POA: Diagnosis not present

## 2017-05-23 MED ORDER — FAMOTIDINE 20 MG PO TABS: 40.0000 mg | ORAL_TABLET | Freq: Two times a day (BID) | ORAL | 0 refills | Status: DC

## 2017-05-23 MED ORDER — FAMOTIDINE 20 MG PO TABS
40.0000 mg | ORAL_TABLET | Freq: Once | ORAL | Status: AC
  Administered 2017-05-23: 40 mg via ORAL
  Filled 2017-05-23: qty 2

## 2017-05-23 NOTE — Discharge Instructions (Signed)
You can take over the counter Pepcid 20-40 milligrams every 12 hours for symptom relief

## 2017-05-23 NOTE — ED Provider Notes (Signed)
[facility] Provider Note   CSN: [id number] Arrival date & time: 05/23/17  [id number]     History   Chief Complaint Chief Complaint  Patient presents with  . Rash    HPI Clinton Allen is a 46 y.o. male.  This is a 46 year old male who presents to emergency department with generalized itching.  He states that he had a very fine rash several days ago and he applied calamine lotion and the rash disappeared, but he has persistent itching is not taking any medication for his symptoms Denies any shortness of breath, tachycardia He works in [employer]eron[employer]      Past Medical History:  Diagnosis Date  . Bipolar 1 disorder (HCC)   . Bronchitis   . Neck fracture (HCC)   . Vitamin D deficiency     Patient Active Problem List   Diagnosis Date Noted  . Encounter for screening colonoscopy 07/29/2016  . Constipation 07/29/2016  . Abdominal bloating 07/29/2016  . Elevated LFTs 07/29/2016  . Elevated lipase 07/29/2016  . Status post right foot surgery 07/31/2015  . Status post left foot surgery 05/02/2015  . Porokeratosis 04/17/2015  . 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 Pain in lower limb 04/17/2015  . Foot pain 01/02/2014  . Chronic pain associated with significant psychosocial dysfunction 01/02/2014  . Bipolar I disorder (HCC) 06/30/2013  . History of surgical procedure 06/30/2013  . Cannabis abuse 06/30/2013  . 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 Cotton Osteotomy w/ Bone Graft Left 04/27/2015  . Cotton Osteotomy w/ Bone Graft Right 06/29/2015  . Hammer Toe Repair Left 04/27/2015   Lt #2  . NECK SURGERY    . TOE SURGERY Bilateral        Home Medications    Prior to Admission medications   Medication Sig Start Date End Date Taking? Authorizing Provider  albuterol (PROVENTIL HFA;VENTOLIN HFA) 108 (90 Base) MCG/ACT inhaler Inhale 2 puffs into the lungs every 6 (six) hours as needed for wheezing or shortness  of breath. 06/28/16   Derwood KaplanNanavati, Ankit, MD  AMBULATORY NON FORMULARY MEDICATION Testosterone Booster I tab by mouth every day    [provider]  AMBULATORY NON FORMULARY MEDICATION Pre-workout N.O. Fury 1 tab by mouth every day    [provider]  linaclotide Karlene Einstein(LINZESS) 145 MCG CAPS capsule Take 1 capsule (145 mcg total) by mouth daily before breakfast. 07/29/16   Zehr, Princella PellegriniJessica D, PA-C  loratadine (CLARITIN) 10 MG tablet Take 1 tablet by mouth daily as needed. 06/04/16   [provider]  sildenafil (VIAGRA) 100 MG tablet Take 100 mg by mouth daily as needed for erectile dysfunction.    [provider]  triamcinolone cream (KENALOG) 0.1 % Apply 1 application topically 2 (two) times daily.    [provider]  Vitamin D, Ergocalciferol, (DRISDOL) 50000 units CAPS capsule Take 50,000 Units by mouth once a week. 07/04/16   [provider]    Family History Family History  Problem Relation Age of Onset  . Heart attack Father   . Hypertension Mother   . Hyperlipidemia Mother   . Hyperkalemia Mother   . Lung cancer Maternal Uncle        smoker    Social History Social History  Substance Use Topics  . Smoking status: Former Smoker    Quit date: 12/16/1993  . Smokeless tobacco: Never Used  . Alcohol use No     Allergies  Eggs or egg-derived products   Review of Systems Review of Systems  Constitutional: Negative for fever.  Respiratory: Negative for shortness of breath.   Skin: Negative for color change and rash.  All other systems reviewed and are negative.    Physical Exam Updated Vital Signs BP 116/66 (BP Location: Right Arm)   Pulse (!) 52   Temp 98 F (36.7 C) (Oral)   Resp 16   Wt 87.5 kg (192 lb 14.4 oz)   SpO2 100%   BMI 26.90 kg/m   Physical Exam  Constitutional: He appears well-developed and well-nourished.  HENT:  Head: Normocephalic.  Neck: Normal range of motion.  Pulmonary/Chest: Effort normal.    Musculoskeletal: Normal range of motion.  Neurological: He is alert.  Skin: Skin is warm and dry. No rash noted.  Psychiatric: He has a normal mood and affect.  Nursing note and vitals reviewed.    ED Treatments / Results  Labs (all labs ordered are listed, but only abnormal results are displayed) Labs Reviewed - No data to display  EKG  EKG Interpretation None       Radiology No results found.  Procedures Procedures (including critical care time)  Medications Ordered in ED Medications  famotidine (PEPCID) tablet 40 mg (not administered)     Initial Impression / Assessment and Plan / ED Course  I have reviewed the triage vital signs and the nursing notes.  Pertinent labs & imaging results that were available during my care of the patient were reviewed by me and considered in my medical decision making (see chart for details).      No visible rash.  Patient will be treated with Pepcid for  puritis, can take Benadryl at night  Final Clinical Impressions(s) / ED Diagnoses   Final diagnoses:  Pruritus    New Prescriptions New Prescriptions   No medications on file     Earley Favor, NP 05/23/17 1610    Geoffery Lyons, MD 05/23/17 [phone]

## 2017-05-23 NOTE — ED Triage Notes (Signed)
Patient reports he is here for poison ivy.  Reports had bumps and applied caladryl and is now just itchy.  No rash noted.

## 2017-05-27 ENCOUNTER — Encounter (HOSPITAL_COMMUNITY): Payer: Self-pay | Admitting: *Deleted

## 2017-05-27 ENCOUNTER — Emergency Department (HOSPITAL_COMMUNITY)
Admission: EM | Admit: 2017-05-27 | Discharge: 2017-05-27 | Disposition: A | Discharge status: Home / Self Care | Payer: Medicaid Other | Attending: Emergency Medicine | Admitting: Emergency Medicine

## 2017-05-27 DIAGNOSIS — Z87891 Personal history of nicotine dependence: Secondary | ICD-10-CM | POA: Diagnosis not present

## 2017-05-27 DIAGNOSIS — R21 Rash and other nonspecific skin eruption: Secondary | ICD-10-CM | POA: Diagnosis present

## 2017-05-27 DIAGNOSIS — L237 Allergic contact dermatitis due to plants, except food: Secondary | ICD-10-CM | POA: Diagnosis not present

## 2017-05-27 MED ORDER — PREDNISONE 10 MG PO TABS: ORAL_TABLET | ORAL | 0 refills | Status: DC

## 2017-05-27 NOTE — ED Triage Notes (Signed)
Pt c/o itching, rash x 2 weeks. Seen in ED for same, no improvement with meds given. Family with similar rash. No meds pta. Alert, appropriate.

## 2017-05-27 NOTE — ED Provider Notes (Signed)
[facility] Provider Note   CSN: [id number] Arrival date & time: 05/27/17  0935    History   Chief Complaint Chief Complaint  Patient presents with  . Rash    HPI Clinton Allen is a 46 y.o. male.  Patient seen 05/23/17 for rash.  There was no visible rash on exam at that time.  Patient was discharged with Pepcid prn itching.  Patient notes that since that visit itching improved only slightly.  He reports that he is convinced he has poison ivy.  He notes that the rash is much more visible in the daylight.  He reports he usually gets a "shot of something" that helps with the itching.  He is asking for this today.  He reports he works in [employer] and this may be how he picked up poison ivy.  Denies CP, SOB, cough, wheeze, diarrhea, vomiting, weeping of lesions.      Past Medical History:  Diagnosis Date  . Bipolar 1 disorder (HCC)   . Bronchitis   . Neck fracture (HCC)   . Vitamin D deficiency     Patient Active Problem List   Diagnosis Date Noted  . Encounter for screening colonoscopy 07/29/2016  . Constipation 07/29/2016  . Abdominal bloating 07/29/2016  . Elevated LFTs 07/29/2016  . Elevated lipase 07/29/2016  . Status post right foot surgery 07/31/2015  . Status post left foot surgery 05/02/2015  . Porokeratosis 04/17/2015  . 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 Pain in lower limb 04/17/2015  . Foot pain 01/02/2014  . Chronic pain associated with significant psychosocial dysfunction 01/02/2014  . Bipolar I disorder (HCC) 06/30/2013  . History of surgical procedure 06/30/2013  . Cannabis abuse 06/30/2013  . 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 Cotton Osteotomy w/ Bone Graft Left 04/27/2015  . Cotton Osteotomy w/ Bone Graft Right 06/29/2015  . Hammer Toe Repair Left 04/27/2015   Lt #2  . NECK SURGERY    . TOE SURGERY Bilateral        Home Medications    Prior to Admission medications     Medication Sig Start Date End Date Taking? Authorizing Provider  albuterol (PROVENTIL HFA;VENTOLIN HFA) 108 (90 Base) MCG/ACT inhaler Inhale 2 puffs into the lungs every 6 (six) hours as needed for wheezing or shortness of breath. 06/28/16   Derwood KaplanNanavati, Ankit, MD  AMBULATORY NON FORMULARY MEDICATION Testosterone Booster I tab by mouth every day    [provider]  AMBULATORY NON FORMULARY MEDICATION Pre-workout N.O. Fury 1 tab by mouth every day    [provider]  famotidine (PEPCID) 20 MG tablet Take 2 tablets (40 mg total) by mouth 2 (two) times daily. 05/23/17   Earley FavorSchulz, Gail, NP  linaclotide Karlene Einstein(LINZESS) 145 MCG CAPS capsule Take 1 capsule (145 mcg total) by mouth daily before breakfast. 07/29/16   Zehr, Princella PellegriniJessica D, PA-C  loratadine (CLARITIN) 10 MG tablet Take 1 tablet by mouth daily as needed. 06/04/16   [provider]  sildenafil (VIAGRA) 100 MG tablet Take 100 mg by mouth daily as needed for erectile dysfunction.    [provider]  triamcinolone cream (KENALOG) 0.1 % Apply 1 application topically 2 (two) times daily.    [provider]  Vitamin D, Ergocalciferol, (DRISDOL) 50000 units CAPS capsule Take 50,000 Units by mouth once a week. 07/04/16   [provider]    Family History Family History  Problem Relation Age of Onset  .  Heart attack Father   . Hypertension Mother   . Hyperlipidemia Mother   . Hyperkalemia Mother   . Lung cancer Maternal Uncle        smoker    Social History Social History  Substance Use Topics  . Smoking status: Former Smoker    Quit date: 12/16/1993  . Smokeless tobacco: Never Used  . Alcohol use No     Allergies   Eggs or egg-derived products   Review of Systems Review of Systems  Constitutional: Negative for activity change, chills, diaphoresis, fatigue and fever.  HENT: Negative for congestion, rhinorrhea, sneezing and trouble swallowing.   Respiratory: Negative for cough, chest tightness,  shortness of breath and wheezing.   Gastrointestinal: Negative for diarrhea and vomiting.  Musculoskeletal: Negative for arthralgias.  Allergic/Immunologic: Positive for environmental allergies.    Physical Exam Updated Vital Signs BP (!) 144/76 (BP Location: Right Arm)   Pulse (!) 48   Temp 98.7 F (37.1 C) (Oral)   Resp 16   Wt 84.4 kg (186 lb)   SpO2 99%   BMI 25.94 kg/m   Physical Exam  Constitutional: He is oriented to person, place, and time. He appears well-developed and well-nourished. No distress.  HENT:  Head: Normocephalic and atraumatic.  Eyes: Conjunctivae and EOM are normal. Pupils are equal, round, and reactive to light. No scleral icterus.  Neck: Normal range of motion. Neck supple.  Cardiovascular: Normal rate, regular rhythm, normal heart sounds and intact distal pulses.   Pulmonary/Chest: Effort normal and breath sounds normal. He has no wheezes.  Musculoskeletal: Normal range of motion.  Neurological: He is alert and oriented to person, place, and time.  Skin: Skin is warm. Capillary refill takes less than 2 seconds. Rash (upper back with small areas of hypopigmentation and intermittent papules. no rash appreciated along forearms.) noted. He is not diaphoretic.  Psychiatric: Thought content normal.  Energetic speech  Nursing note and vitals reviewed.    ED Treatments / Results  Labs (all labs ordered are listed, but only abnormal results are displayed) Labs Reviewed - No data to display  EKG  EKG Interpretation None       Radiology No results found.  Procedures Procedures (including critical care time)  Medications Ordered in ED Medications - No data to display   Initial Impression / Assessment and Plan / ED Course  I have reviewed the triage vital signs and the nursing notes.  Pertinent labs & imaging results that were available during my care of the patient were reviewed by me and considered in my medical decision making (see chart for  details).     Final Clinical Impressions(s) / ED Diagnoses   Final diagnoses:  Allergic contact dermatitis due to plants, except food   Clinton Allen is 46 y.o. male that presents with rash.  Rash is mild and nonspecific.  Likely allergic/ contact given nature of patient's profession.  However, no rhus dermatitis seen to suggest poison ivy/ oak contact.  He was very adamant about oral steroid prescription.  Discussion related to the risk of steroid use for a mild rash was reviewed with patient.  He voiced understanding and accepted these risks.  A pocket rx was provided for patient to use starting this weekend should symptoms persist or worsen.  In the interim, patient recommended to start oral claritin 10mg  daily for itching.  He has the rx at home and did not require a written rx today.  He can use Benadryl 25mg  po QHS  prn breakthrough itching.  Return precautions reviewed.  Patient to follow up with PCP in next week if persistent symptoms or return to ED if concerning symptoms arise.  New Prescriptions Meds ordered this encounter  Medications  . predniSONE (DELTASONE) 10 MG tablet    Sig: Take 60mg  by mouth day 1, 50mg  day 2, 40mg  day 3, 30mg  day 4, 20mg  day 5, 10mg  day 6.  Then stop.    Dispense:  21 tablet    Refill:  0      Raliegh Ip, DO 05/27/17 1037    Blane Ohara, MD 05/27/17 1300

## 2017-05-27 NOTE — ED Notes (Signed)
ED Provider at bedside. 

## 2017-05-27 NOTE — Discharge Instructions (Signed)
As we discussed, your rash is likely allergic and related to something you have come in contact with.  Your rash is not typical of a poison ivy dermatitis.  I recommend that you start taking your Claritin again.  You noted that you have medication at home and did not need a new prescription.  Claritin will help relieve the itching associated with the rash.  If you have break through itching, you can take a Benadryl at bedtime as well.  You have been given a pocket prescription to use starting Saturday if you have persistent itching despite use of Claritin and Benadryl.  You voiced good understanding of the risk of steroid use and accepted these risks.  Follow up with your primary care provider if itching persists despite medications.

## 2017-06-25 ENCOUNTER — Encounter (HOSPITAL_COMMUNITY): Payer: Self-pay | Admitting: Emergency Medicine

## 2017-06-25 ENCOUNTER — Emergency Department (HOSPITAL_COMMUNITY)
Admission: EM | Admit: 2017-06-25 | Discharge: 2017-06-25 | Disposition: A | Discharge status: Home / Self Care | Payer: Medicaid Other | Attending: Emergency Medicine | Admitting: Emergency Medicine

## 2017-06-25 DIAGNOSIS — Z79899 Other long term (current) drug therapy: Secondary | ICD-10-CM | POA: Diagnosis not present

## 2017-06-25 DIAGNOSIS — R21 Rash and other nonspecific skin eruption: Secondary | ICD-10-CM

## 2017-06-25 DIAGNOSIS — Z91012 Allergy to eggs: Secondary | ICD-10-CM | POA: Insufficient documentation

## 2017-06-25 DIAGNOSIS — L739 Follicular disorder, unspecified: Secondary | ICD-10-CM | POA: Diagnosis not present

## 2017-06-25 DIAGNOSIS — Z87891 Personal history of nicotine dependence: Secondary | ICD-10-CM | POA: Diagnosis not present

## 2017-06-25 NOTE — ED Provider Notes (Signed)
[facility] Provider Note   CSN: [id number] Arrival date & time: 06/25/17  1914  By signing my name below, I, Thelma Barge, attest that this documentation has been prepared under the direction and in the presence of Langston Masker, [place]. Electronically Signed: Thelma Barge, Scribe. 06/25/17. 9:40 AM.  History   Chief Complaint Chief Complaint  Patient presents with  . Rash   The history is provided by the patient. No language interpreter was used.    HPI Comments: Clinton Allen is a 46 y.o. male who presents to the Emergency Department complaining of constant, gradually worsening pruritic rash for over 1 month. He states he was cutting "bad grass" when the symptoms began, and was diagnosed with poison ivy. He has been seen here previously for this and was given steroids but he did not use them. He has also tried using benadryl and caladryl with mild to no relief. Pt has not seen a dermatologist for this. He has NKDA other than eggs.  Past Medical History:  Diagnosis Date  . Bipolar 1 disorder (HCC)   . Bronchitis   . Neck fracture (HCC)   . Vitamin D deficiency     Patient Active Problem List   Diagnosis Date Noted  . Encounter for screening colonoscopy 07/29/2016  . Constipation 07/29/2016  . Abdominal bloating 07/29/2016  . Elevated LFTs 07/29/2016  . Elevated lipase 07/29/2016  . Status post right foot surgery 07/31/2015  . Status post left foot surgery 05/02/2015  . Porokeratosis 04/17/2015  . 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 Pain in lower limb 04/17/2015  . Foot pain 01/02/2014  . Chronic pain associated with significant psychosocial dysfunction 01/02/2014  . Bipolar I disorder (HCC) 06/30/2013  . History of surgical procedure 06/30/2013  . Cannabis abuse 06/30/2013  . 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 Cotton Osteotomy w/ Bone Graft Left 04/27/2015  . Cotton Osteotomy w/ Bone Graft Right  06/29/2015  . Hammer Toe Repair Left 04/27/2015   Lt #2  . NECK SURGERY    . TOE SURGERY Bilateral        Home Medications    Prior to Admission medications   Medication Sig Start Date End Date Taking? Authorizing Provider  albuterol (PROVENTIL HFA;VENTOLIN HFA) 108 (90 Base) MCG/ACT inhaler Inhale 2 puffs into the lungs every 6 (six) hours as needed for wheezing or shortness of breath. 06/28/16   Derwood Kaplan, MD  AMBULATORY NON FORMULARY MEDICATION Testosterone Booster I tab by mouth every day    [provider]  AMBULATORY NON FORMULARY MEDICATION Pre-workout N.O. Fury 1 tab by mouth every day    [provider]  famotidine (PEPCID) 20 MG tablet Take 2 tablets (40 mg total) by mouth 2 (two) times daily. 05/23/17   Earley Favor, NP  linaclotide Karlene Einstein) 145 MCG CAPS capsule Take 1 capsule (145 mcg total) by mouth daily before breakfast. 07/29/16   Zehr, Princella Pellegrini, PA-C  loratadine (CLARITIN) 10 MG tablet Take 1 tablet by mouth daily as needed. 06/04/16   [provider]  predniSONE (DELTASONE) 10 MG tablet Take 60mg  by mouth day 1, 50mg  day 2, 40mg  day 3, 30mg  day 4, 20mg  day 5, 10mg  day 6.  Then stop. 05/31/17   Raliegh Ip, DO  sildenafil (VIAGRA) 100 MG tablet Take 100 mg by mouth daily as needed for erectile dysfunction.    [provider]  triamcinolone cream (KENALOG) 0.1 % Apply 1  application topically 2 (two) times daily.    [provider]  Vitamin D, Ergocalciferol, (DRISDOL) 50000 units CAPS capsule Take 50,000 Units by mouth once a week. 07/04/16   [provider]    Family History Family History  Problem Relation Age of Onset  . Heart attack Father   . Hypertension Mother   . Hyperlipidemia Mother   . Hyperkalemia Mother   . Lung cancer Maternal Uncle        smoker    Social History Social History  Substance Use Topics  . Smoking status: Former Smoker    Quit date: 12/16/1993  . Smokeless tobacco: Never  Used  . Alcohol use No     Allergies   Eggs or egg-derived products   Review of Systems Review of Systems  Skin: Positive for rash.  All other systems reviewed and are negative.    Physical Exam Updated Vital Signs BP 127/70 (BP Location: Left Arm)   Pulse 62   Temp 97.7 F (36.5 C) (Oral)   Resp 20   SpO2 97%   Physical Exam  Constitutional: He is oriented to person, place, and time. He appears well-developed and well-nourished.  HENT:  Head: Normocephalic.  Eyes: EOM are normal.  Neck: Normal range of motion.  Pulmonary/Chest: Effort normal.  Abdominal: He exhibits no distension.  Musculoskeletal: Normal range of motion.  Neurological: He is alert and oriented to person, place, and time.  Skin:  Few, scattered, inflamed hair follicles  Psychiatric: He has a normal mood and affect.  Nursing note and vitals reviewed.    ED Treatments / Results  DIAGNOSTIC STUDIES: Oxygen Saturation is 97% on RA, normal by my interpretation.    COORDINATION OF CARE: 9:36 AM Discussed treatment plan with pt at bedside and pt agreed to plan.  Labs (all labs ordered are listed, but only abnormal results are displayed) Labs Reviewed - No data to display  EKG  EKG Interpretation None       Radiology No results found.  Procedures Procedures (including critical care time)  Medications Ordered in ED Medications - No data to display   Initial Impression / Assessment and Plan / ED Course  I have reviewed the triage vital signs and the nursing notes.  Pertinent labs & imaging results that were available during my care of the patient were reviewed by me and considered in my medical decision making (see chart for details).       Final Clinical Impressions(s) / ED Diagnoses   Final diagnoses:  Rash  Folliculitis    New Prescriptions New Prescriptions   No medications on file  Pt offer topical antibiotics,   Pt declined.  He wants to find out what rash is.   Pt  would prefer to see a dermatologist.  Pt advised he may need primary care referral. An After Visit Summary was printed and given to the patient. I personally performed the services in this documentation, which was scribed in my presence.  The recorded information has been reviewed and considered.   Barnet PallKaren SofiaPAC.    Elson AreasSofia, Leslie K, [place]PA-C 06/25/17 [id number]    Geoffery Lyonselo, [place], MD 06/28/17 0000

## 2017-06-25 NOTE — ED Triage Notes (Signed)
Itching for a long time all over, states he works  [employer] maybe poison ivy

## 2017-11-07 ENCOUNTER — Encounter (HOSPITAL_COMMUNITY): Payer: Self-pay | Admitting: Emergency Medicine

## 2017-11-07 ENCOUNTER — Other Ambulatory Visit: Payer: Self-pay

## 2017-11-07 ENCOUNTER — Emergency Department (HOSPITAL_COMMUNITY)
Admission: EM | Admit: 2017-11-07 | Discharge: 2017-11-07 | Disposition: A | Discharge status: Home / Self Care | Payer: Medicaid Other | Attending: Emergency Medicine | Admitting: Emergency Medicine

## 2017-11-07 ENCOUNTER — Emergency Department (HOSPITAL_COMMUNITY): Payer: Medicaid Other

## 2017-11-07 DIAGNOSIS — R51 Headache: Secondary | ICD-10-CM | POA: Diagnosis present

## 2017-11-07 DIAGNOSIS — Z87891 Personal history of nicotine dependence: Secondary | ICD-10-CM | POA: Insufficient documentation

## 2017-11-07 DIAGNOSIS — R519 Headache, unspecified: Secondary | ICD-10-CM

## 2017-11-07 MED ORDER — METOCLOPRAMIDE HCL 10 MG PO TABS
5.0000 mg | ORAL_TABLET | Freq: Once | ORAL | Status: AC
  Administered 2017-11-07: 5 mg via ORAL
  Filled 2017-11-07: qty 1

## 2017-11-07 MED ORDER — KETOROLAC TROMETHAMINE 30 MG/ML IJ SOLN
30.0000 mg | Freq: Once | INTRAMUSCULAR | Status: AC
  Administered 2017-11-07: 30 mg via INTRAVENOUS
  Filled 2017-11-07: qty 1

## 2017-11-07 MED ORDER — DIPHENHYDRAMINE HCL 25 MG PO CAPS
25.0000 mg | ORAL_CAPSULE | Freq: Once | ORAL | Status: AC
  Administered 2017-11-07: 25 mg via ORAL
  Filled 2017-11-07: qty 1

## 2017-11-07 NOTE — Discharge Instructions (Signed)
Please read attached information. If you experience any new or worsening signs or symptoms please return to the emergency room for evaluation. Please follow-up with your primary care provider or specialist as discussed.  °

## 2017-11-07 NOTE — ED Triage Notes (Signed)
Pt states "Head pain=not a headache, it feels like a pressure on my head, coming from inside to out, goes from side to side"

## 2017-11-07 NOTE — ED Notes (Signed)
Patient transported to CT 

## 2017-11-07 NOTE — ED Provider Notes (Signed)
[facility] EMERGENCY DEPARTMENT Provider Note   CSN: [id number] Arrival date & time: 11/07/17  1031     History   Chief Complaint Chief Complaint  Patient presents with  . Headache    HPI Cyril LoosenMichael S Mumme is a 46 y.o. male.  HPI   46 year old male presents today with complaints of head pain.  Patient reports a 2-week history of right-sided head pain that has been waxing and waning but not completely resolving.  No history of trauma, denies any associated photophobia or phonophobia, no associated nausea vomiting, fever, neck stiffness, or any neurological deficits.  Patient denies any upper respiratory congestion.  Patient originally reports that he did not take any medication for this, shortly after reports that he was taking Sudafed as he thought this potentially could be a sinus headache.  No alleviating or aggravating factors.  Patient denies any history of previous headaches or migraines.   Past Medical History:  Diagnosis Date  . Bipolar 1 disorder (HCC)   . Bronchitis   . Neck fracture (HCC)   . Vitamin D deficiency     Patient Active Problem List   Diagnosis Date Noted  . Encounter for screening colonoscopy 07/29/2016  . Constipation 07/29/2016  . Abdominal bloating 07/29/2016  . Elevated LFTs 07/29/2016  . Elevated lipase 07/29/2016  . Status post right foot surgery 07/31/2015  . Status post left foot surgery 05/02/2015  . Porokeratosis 04/17/2015  . 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 Pain in lower limb 04/17/2015  . Foot pain 01/02/2014  . Chronic pain associated with significant psychosocial dysfunction 01/02/2014  . Bipolar I disorder (HCC) 06/30/2013  . History of surgical procedure 06/30/2013  . Cannabis abuse 06/30/2013  . 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 Cotton Osteotomy w/ Bone Graft Left 04/27/2015  . Cotton Osteotomy w/ Bone Graft Right 06/29/2015  . Hammer Toe  Repair Left 04/27/2015   Lt #2  . NECK SURGERY    . TOE SURGERY Bilateral        Home Medications    Prior to Admission medications   Medication Sig Start Date End Date Taking? Authorizing Provider  albuterol (PROVENTIL HFA;VENTOLIN HFA) 108 (90 Base) MCG/ACT inhaler Inhale 2 puffs into the lungs every 6 (six) hours as needed for wheezing or shortness of breath. 06/28/16  Yes Derwood KaplanNanavati, Ankit, MD  AMBULATORY NON FORMULARY MEDICATION Testosterone Booster I tab by mouth every day   Yes [provider]  AMBULATORY NON FORMULARY MEDICATION Pre-workout N.O. Fury 1 tab by mouth every day   Yes [provider]  loratadine (CLARITIN) 10 MG tablet Take 1 tablet by mouth daily as needed. 06/04/16  Yes [provider]  sildenafil (VIAGRA) 100 MG tablet Take 100 mg by mouth daily as needed for erectile dysfunction.   Yes [provider]  triamcinolone cream (KENALOG) 0.1 % Apply 1 application topically 2 (two) times daily.   Yes [provider]  Vitamin D, Ergocalciferol, (DRISDOL) 50000 units CAPS capsule Take 50,000 Units by mouth once a week. 07/04/16  Yes [provider]  linaclotide Karlene Einstein(LINZESS) 145 MCG CAPS capsule Take 1 capsule (145 mcg total) by mouth daily before breakfast. Patient not taking: Reported on 11/07/2017 07/29/16   Zehr, Princella PellegriniJessica D, PA-C  predniSONE (DELTASONE) 10 MG tablet Take 60mg  by mouth day 1, 50mg  day 2, 40mg  day 3, 30mg  day 4, 20mg  day 5, 10mg  day 6.  Then stop. Patient not  taking: Reported on 11/07/2017 05/31/17   Raliegh IpGottschalk, Ashly M, DO    Family History Family History  Problem Relation Age of Onset  . Heart attack Father   . Hypertension Mother   . Hyperlipidemia Mother   . Hyperkalemia Mother   . Lung cancer Maternal Uncle        smoker    Social History Social History   Tobacco Use  . Smoking status: Former Smoker    Last attempt to quit: 12/16/1993    Years since quitting: 23.9  . Smokeless tobacco: Never  Used  Substance Use Topics  . Alcohol use: No  . Drug use: Yes    Types: Marijuana     Allergies   Eggs or egg-derived products   Review of Systems Review of Systems  All other systems reviewed and are negative.    Physical Exam Updated Vital Signs BP 120/82 (BP Location: Right Arm)   Pulse (!) 42   Temp 98.2 F (36.8 C) (Oral)   Resp 16   Ht 5\' 11"  (1.803 m)   Wt 83.9 kg (185 lb)   SpO2 100%   BMI 25.80 kg/m   Physical Exam  Constitutional: He is oriented to person, place, and time. He appears well-developed and well-nourished.  HENT:  Head: Normocephalic and atraumatic.  Eyes: Conjunctivae are normal. Pupils are equal, round, and reactive to light. Right eye exhibits no discharge. Left eye exhibits no discharge. No scleral icterus.  Neck: Normal range of motion. No JVD present. No tracheal deviation present.  Pulmonary/Chest: Effort normal. No stridor.  Neurological: He is alert and oriented to person, place, and time. He has normal strength. Coordination normal. GCS eye subscore is 4. GCS verbal subscore is 5. GCS motor subscore is 6.  Psychiatric: He has a normal mood and affect. His behavior is normal. Judgment and thought content normal.  Nursing note and vitals reviewed.    ED Treatments / Results  Labs (all labs ordered are listed, but only abnormal results are displayed) Labs Reviewed - No data to display  EKG  EKG Interpretation None       Radiology Ct Head Wo Contrast  Result Date: 11/07/2017 CLINICAL DATA:  Headache x2 weeks EXAM: CT HEAD WITHOUT CONTRAST TECHNIQUE: Contiguous axial images were obtained from the base of the skull through the vertex without intravenous contrast. COMPARISON:  None. FINDINGS: Brain: No evidence of acute infarction, hemorrhage, hydrocephalus, extra-axial collection or mass lesion/mass effect. Vascular: No hyperdense vessel or unexpected calcification. Skull: Normal. Negative for fracture or focal lesion.  Sinuses/Orbits: The visualized paranasal sinuses are essentially clear. The mastoid air cells are unopacified. Other: None. IMPRESSION: Normal head CT. Electronically Signed   By: Charline BillsSriyesh  Krishnan M.D.   On: 11/07/2017 12:42    Procedures Procedures (including critical care time)  Medications Ordered in ED Medications  ketorolac (TORADOL) 30 MG/ML injection 30 mg (30 mg Intravenous Given 11/07/17 1219)  metoCLOPramide (REGLAN) tablet 5 mg (5 mg Oral Given 11/07/17 1218)  diphenhydrAMINE (BENADRYL) capsule 25 mg (25 mg Oral Given 11/07/17 1218)     Initial Impression / Assessment and Plan / ED Course  I have reviewed the triage vital signs and the nursing notes.  Pertinent labs & imaging results that were available during my care of the patient were reviewed by me and considered in my medical decision making (see chart for details).      Final Clinical Impressions(s) / ED Diagnoses   Final diagnoses:  Acute nonintractable headache, unspecified headache  type    Labs:   Imaging: CT head wo  Consults:  Therapeutics: Toradol, Reglan, Benadryl  Discharge Meds:   Assessment/Plan: 46 year old male presents today with complaints of headache.  Patient denies any significant history of the same, reports this is been persistent despite medications.  Patient CT appears normal, he has no other concerning signs or symptoms here today.  I encouraged him to follow-up with neurology if symptoms persist, strict return precautions given.  He verbalized understanding and agreement to this plan had no further questions or concerns at time of discharge.    ED Discharge Orders    None       Eyvonne Mechanic, Cordelia Poche 11/07/17 1500    Raeford Razor, MD 11/07/17 1627

## 2017-11-07 NOTE — ED Notes (Signed)
toradol administered in Right deltoid IM per EDP verbal order

## 2017-12-01 ENCOUNTER — Ambulatory Visit: Payer: Self-pay | Admitting: Cardiology

## 2017-12-05 ENCOUNTER — Encounter: Payer: Self-pay | Admitting: Cardiology

## 2017-12-05 ENCOUNTER — Ambulatory Visit (INDEPENDENT_AMBULATORY_CARE_PROVIDER_SITE_OTHER): Payer: Medicaid Other | Admitting: Cardiology

## 2017-12-05 VITALS — BP 126/80 | HR 52 | Ht 71.0 in | Wt 183.0 lb

## 2017-12-05 DIAGNOSIS — Z79899 Other long term (current) drug therapy: Secondary | ICD-10-CM | POA: Diagnosis not present

## 2017-12-05 DIAGNOSIS — R072 Precordial pain: Secondary | ICD-10-CM | POA: Diagnosis not present

## 2017-12-05 DIAGNOSIS — I308 Other forms of acute pericarditis: Secondary | ICD-10-CM

## 2017-12-05 DIAGNOSIS — I300 Acute nonspecific idiopathic pericarditis: Secondary | ICD-10-CM

## 2017-12-05 LAB — COMPREHENSIVE METABOLIC PANEL
ALT: 23 IU/L (ref 0–44)
AST: 23 IU/L (ref 0–40)
Albumin/Globulin Ratio: 2 (ref 1.2–2.2)
Albumin: 4.7 g/dL (ref 3.5–5.5)
Alkaline Phosphatase: 86 IU/L (ref 39–117)
BUN/Creatinine Ratio: 18 (ref 9–20)
BUN: 17 mg/dL (ref 6–24)
Bilirubin Total: 0.3 mg/dL (ref 0.0–1.2)
CO2: 23 mmol/L (ref 20–29)
Calcium: 9.6 mg/dL (ref 8.7–10.2)
Chloride: 104 mmol/L (ref 96–106)
Creatinine, Ser: 0.95 mg/dL (ref 0.76–1.27)
GFR calc Af Amer: 110 mL/min/{1.73_m2} (ref >59)
GFR calc non Af Amer: 95 mL/min/{1.73_m2} (ref >59)
Globulin, Total: 2.3 g/dL (ref 1.5–4.5)
Glucose: 109 mg/dL — ABNORMAL HIGH (ref 65–99)
Potassium: 4.6 mmol/L (ref 3.5–5.2)
Sodium: 140 mmol/L (ref 134–144)
Total Protein: 7 g/dL (ref 6.0–8.5)

## 2017-12-05 LAB — TSH: TSH: 0.194 u[IU]/mL — ABNORMAL LOW (ref 0.450–4.500)

## 2017-12-05 LAB — LIPID PANEL
Chol/HDL Ratio: 2.7 ratio (ref 0.0–5.0)
Cholesterol, Total: 170 mg/dL (ref 100–199)
HDL: 62 mg/dL (ref >39)
LDL Calculated: 102 mg/dL — ABNORMAL HIGH (ref 0–99)
Triglycerides: 32 mg/dL (ref 0–149)
VLDL Cholesterol Cal: 6 mg/dL (ref 5–40)

## 2017-12-05 LAB — CBC WITH DIFFERENTIAL/PLATELET
Basophils Absolute: 0 10*3/uL (ref 0.0–0.2)
Basos: 0 %
EOS (ABSOLUTE): 0 10*3/uL (ref 0.0–0.4)
Eos: 0 %
Hematocrit: 39.7 % (ref 37.5–51.0)
Hemoglobin: 13.4 g/dL (ref 13.0–17.7)
Immature Grans (Abs): 0 10*3/uL (ref 0.0–0.1)
Immature Granulocytes: 0 %
Lymphocytes Absolute: 0.8 10*3/uL (ref 0.7–3.1)
Lymphs: 14 %
MCH: 32.3 pg (ref 26.6–33.0)
MCHC: 33.8 g/dL (ref 31.5–35.7)
MCV: 96 fL (ref 79–97)
Monocytes Absolute: 0.3 10*3/uL (ref 0.1–0.9)
Monocytes: 4 %
Neutrophils Absolute: 4.7 10*3/uL (ref 1.4–7.0)
Neutrophils: 82 %
Platelets: 216 10*3/uL (ref 150–379)
RBC: 4.15 x10E6/uL (ref 4.14–5.80)
RDW: 12.9 % (ref 12.3–15.4)
WBC: 5.8 10*3/uL (ref 3.4–10.8)

## 2017-12-05 MED ORDER — LINACLOTIDE 145 MCG PO CAPS: 145.0000 ug | ORAL_CAPSULE | Freq: Every day | ORAL | 11 refills | Status: DC

## 2017-12-05 NOTE — Patient Instructions (Addendum)
Medication Instructions:  Your physician recommends that you continue on your current medications as directed. Please refer to the Current Medication list given to you today.  * If you need a refill on your cardiac medications before your next appointment, please call your pharmacy. *  *Refills for Linzess were sent to your pharmacy*  Labwork: Today: CMET, CBC w/ diff, TSH & Lipid profile  Testing/Procedures: None ordered  Follow-Up: Your physician wants you to follow-up in: 1 year with Dr. Delton SeeNelson.  You will receive a reminder letter in the mail two months in advance. If you don't receive a letter, please call our office to schedule the follow-up appointment.  Thank you for choosing [facility]!!
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
## 2017-12-12 ENCOUNTER — Telehealth: Payer: Self-pay | Admitting: *Deleted

## 2017-12-12 ENCOUNTER — Other Ambulatory Visit: Payer: Medicaid Other | Admitting: *Deleted

## 2017-12-12 DIAGNOSIS — R7989 Other specified abnormal findings of blood chemistry: Secondary | ICD-10-CM

## 2017-12-12 NOTE — Telephone Encounter (Signed)
Notified the pt that per Dr Delton SeeNelson, all his labs were normal except for low TSH, and she recommends that we check a free T3 and free T4.  Pt states he will come in now for his lab. Lab appt made and order in.  Pt verbalized understanding and agrees with this plan.

## 2017-12-12 NOTE — Telephone Encounter (Signed)
-----   Message from Lars MassonKatarina H Nelson, MD sent at 12/12/2017 11:39 AM EST ----- All labs normal except for low TSH, we need to check free T4 and free T3.

## 2017-12-13 LAB — T3, FREE: T3, Free: 3 pg/mL (ref 2.0–4.4)

## 2017-12-13 LAB — T4, FREE: Free T4: 1.19 ng/dL (ref 0.82–1.77)

## 2017-12-15 ENCOUNTER — Telehealth: Payer: Self-pay | Admitting: Cardiology

## 2017-12-15 NOTE — Telephone Encounter (Signed)
Informed patient that Dr. Delton SeeNelson has not yet reviewed labs, but they appear to be within normal limits.  Instructed the patient that he will be called if Dr. Delton SeeNelson has further recommendations.

## 2017-12-15 NOTE — Telephone Encounter (Signed)
New message ° ° °Patient calling for lab results. Please call °

## 2018-03-25 ENCOUNTER — Encounter (HOSPITAL_BASED_OUTPATIENT_CLINIC_OR_DEPARTMENT_OTHER): Payer: Self-pay | Admitting: *Deleted

## 2018-03-25 ENCOUNTER — Other Ambulatory Visit: Payer: Self-pay

## 2018-03-31 NOTE — Patient Instructions (Addendum)
Cyril LoosenMichael S Glad  03/31/2018   Your procedure is scheduled on: 04/08/2017    Report to [facility] Main  Entrance  Report to admitting at    0630  AM    Call this number if you have problems the morning of surgery [phone]   Remember: Do not eat food or drink liquids :After Midnight.     Take these medicines the morning of surgery with A SIP OF WATER: Albuterol Inhaler if needed and bring                                 You may not have any metal on your body including hair pins and              piercings  Do not wear jewelry, , lotions, powders or perfumes, deodorant                          Men may shave face and neck.   Do not bring valuables to the hospital. [facility] IS NOT             RESPONSIBLE   FOR VALUABLES.  Contacts, dentures or bridgework may not be worn into surgery.      Patients discharged the day of surgery will not be allowed to drive home.  Name and phone number of your driver:                Please read over the following fact sheets you were given: _____________________________________________________________________           NO SOLID FOOD AFTER MIDNIGHT THE NIGHT PRIOR TO SURGERY. NOTHING BY MOUTH EXCEPT CLEAR LIQUIDS UNTIL 3 HOURS PRIOR TO SCHEULED SURGERY. PLEASE FINISH ENSURE DRINK PER SURGEON ORDER 3 HOURS PRIOR TO SCHEDULED SURGERY TIME WHICH NEEDS TO BE COMPLETED AT ______0530______.   [facility] - Preparing for Surgery Before surgery, you can play an important role.  Because skin is not sterile, your skin needs to be as free of germs as possible.  You can reduce the number of germs on your skin by washing with CHG (chlorahexidine gluconate) soap before surgery.  CHG is an antiseptic cleaner which kills germs and bonds with the skin to continue killing germs even after washing. Please DO NOT use if you have an allergy to CHG or antibacterial soaps.  If your skin becomes reddened/irritated stop using the CHG and  inform your nurse when you arrive at Short Stay. Do not shave (including legs and underarms) for at least 48 hours prior to the first CHG shower.  You may shave your face/neck. Please follow these instructions carefully:  1.  Shower with CHG Soap the night before surgery and the  morning of Surgery.  2.  If you choose to wash your hair, wash your hair first as usual with your  normal  shampoo.  3.  After you shampoo, rinse your hair and body thoroughly to remove the  shampoo.                           4.  Use CHG as you would any other liquid soap.  You can apply chg directly  to the skin and wash  Gently with a scrungie or clean washcloth.  5.  Apply the CHG Soap to your body ONLY FROM THE NECK DOWN.   Do not use on face/ open                           Wound or open sores. Avoid contact with eyes, ears mouth and genitals (private parts).                       Wash face,  Genitals (private parts) with your normal soap.             6.  Wash thoroughly, paying special attention to the area where your surgery  will be performed.  7.  Thoroughly rinse your body with warm water from the neck down.  8.  DO NOT shower/wash with your normal soap after using and rinsing off  the CHG Soap.                9.  Pat yourself dry with a clean towel.            10.  Wear clean pajamas.            11.  Place clean sheets on your bed the night of your first shower and do not  sleep with pets. Day of Surgery : Do not apply any lotions/deodorants the morning of surgery.  Please wear clean clothes to the hospital/surgery center.  FAILURE TO FOLLOW THESE INSTRUCTIONS MAY RESULT IN THE CANCELLATION OF YOUR SURGERY PATIENT SIGNATURE_________________________________  NURSE SIGNATURE__________________________________  ________________________________________________________________________

## 2018-04-01 ENCOUNTER — Other Ambulatory Visit: Payer: Self-pay

## 2018-04-01 ENCOUNTER — Encounter (HOSPITAL_COMMUNITY)
Admission: RE | Admit: 2018-04-01 | Discharge: 2018-04-01 | Disposition: A | Discharge status: Home / Self Care | Payer: Medicaid Other | Source: Ambulatory Visit | Attending: Surgery | Admitting: Surgery

## 2018-04-01 ENCOUNTER — Encounter (HOSPITAL_COMMUNITY): Payer: Self-pay

## 2018-04-01 DIAGNOSIS — Z01812 Encounter for preprocedural laboratory examination: Secondary | ICD-10-CM | POA: Insufficient documentation

## 2018-04-01 HISTORY — DX: Other complications of anesthesia, initial encounter: T88.59XA

## 2018-04-01 HISTORY — DX: Adverse effect of unspecified anesthetic, initial encounter: T41.45XA

## 2018-04-01 LAB — CBC
HCT: 41.7 % (ref 39.0–52.0)
Hemoglobin: 13.6 g/dL (ref 13.0–17.0)
MCH: 31.6 pg (ref 26.0–34.0)
MCHC: 32.6 g/dL (ref 30.0–36.0)
MCV: 96.8 fL (ref 78.0–100.0)
PLATELETS: 205 10*3/uL (ref 150–400)
RBC: 4.31 MIL/uL (ref 4.22–5.81)
RDW: 12.7 % (ref 11.5–15.5)
WBC: 4.6 10*3/uL (ref 4.0–10.5)

## 2018-04-01 NOTE — Progress Notes (Signed)
OVN 12-05-17 Epic CARDS   EKG 12-05-17 Epic   ECHO 2017 EPIC

## 2018-04-07 NOTE — Anesthesia Preprocedure Evaluation (Addendum)
Anesthesia Evaluation  Patient identified by MRN, date of birth, ID band Patient awake    Reviewed: Allergy & Precautions, NPO status , Patient's Chart, lab work & pertinent test results  Airway Mallampati: II  TM Distance: >3 FB Neck ROM: Full    Dental no notable dental hx. (+) Dental Advisory Given   Pulmonary former smoker,    Pulmonary exam normal        Cardiovascular negative cardio ROS Normal cardiovascular exam     Neuro/Psych PSYCHIATRIC DISORDERS Bipolar Disorder negative neurological ROS     GI/Hepatic negative GI ROS, (+)     substance abuse  marijuana use,   Endo/Other  negative endocrine ROS  Renal/GU negative Renal ROS     Musculoskeletal negative musculoskeletal ROS (+)   Abdominal   Peds  Hematology negative hematology ROS (+)   Anesthesia Other Findings Day of surgery medications reviewed with the patient.  Reproductive/Obstetrics                            Anesthesia Physical Anesthesia Plan  ASA: II  Anesthesia Plan: General   Post-op Pain Management:    Induction: Intravenous  PONV Risk Score and Plan: 2 and Ondansetron and Dexamethasone  Airway Management Planned: LMA  Additional Equipment:   Intra-op Plan:   Post-operative Plan: Extubation in OR  Informed Consent: I have reviewed the patients History and Physical, chart, labs and discussed the procedure including the risks, benefits and alternatives for the proposed anesthesia with the patient or authorized representative who has indicated his/her understanding and acceptance.   Dental advisory given  Plan Discussed with: Anesthesiologist  Anesthesia Plan Comments:        Anesthesia Quick Evaluation

## 2018-04-08 ENCOUNTER — Ambulatory Visit (HOSPITAL_COMMUNITY)
Admission: RE | Admit: 2018-04-08 | Discharge: 2018-04-08 | Disposition: A | Discharge status: Home / Self Care | Payer: Medicaid Other | Source: Ambulatory Visit | Attending: Surgery | Admitting: Surgery

## 2018-04-08 ENCOUNTER — Ambulatory Visit (HOSPITAL_COMMUNITY): Payer: Medicaid Other | Admitting: Certified Registered Nurse Anesthetist

## 2018-04-08 ENCOUNTER — Encounter (HOSPITAL_COMMUNITY)
Admission: RE | Disposition: A | Discharge status: Home / Self Care | Payer: Self-pay | Source: Ambulatory Visit | Attending: Surgery

## 2018-04-08 ENCOUNTER — Encounter (HOSPITAL_COMMUNITY): Payer: Self-pay

## 2018-04-08 DIAGNOSIS — Z79899 Other long term (current) drug therapy: Secondary | ICD-10-CM | POA: Insufficient documentation

## 2018-04-08 DIAGNOSIS — F1210 Cannabis abuse, uncomplicated: Secondary | ICD-10-CM | POA: Insufficient documentation

## 2018-04-08 DIAGNOSIS — Z91018 Allergy to other foods: Secondary | ICD-10-CM | POA: Diagnosis not present

## 2018-04-08 DIAGNOSIS — Z8249 Family history of ischemic heart disease and other diseases of the circulatory system: Secondary | ICD-10-CM | POA: Diagnosis not present

## 2018-04-08 DIAGNOSIS — Z87891 Personal history of nicotine dependence: Secondary | ICD-10-CM | POA: Diagnosis not present

## 2018-04-08 DIAGNOSIS — F319 Bipolar disorder, unspecified: Secondary | ICD-10-CM | POA: Diagnosis not present

## 2018-04-08 DIAGNOSIS — L723 Sebaceous cyst: Secondary | ICD-10-CM | POA: Diagnosis present

## 2018-04-08 HISTORY — PX: CYST EXCISION: SHX5701

## 2018-04-08 HISTORY — DX: Cannabis abuse, uncomplicated: F12.10

## 2018-04-08 SURGERY — CYST REMOVAL
Anesthesia: General | Laterality: Right

## 2018-04-08 MED ORDER — HYDROCODONE-ACETAMINOPHEN 5-325 MG PO TABS: 1.0000 | ORAL_TABLET | Freq: Four times a day (QID) | ORAL | 0 refills | Status: DC | PRN

## 2018-04-08 MED ORDER — PROPOFOL 10 MG/ML IV BOLUS
INTRAVENOUS | Status: AC
  Filled 2018-04-08: qty 20

## 2018-04-08 MED ORDER — POVIDONE-IODINE 10 % EX OINT
TOPICAL_OINTMENT | CUTANEOUS | Status: AC
  Filled 2018-04-08: qty 28.35

## 2018-04-08 MED ORDER — GABAPENTIN 300 MG PO CAPS
300.0000 mg | ORAL_CAPSULE | ORAL | Status: AC
  Administered 2018-04-08: 300 mg via ORAL
  Filled 2018-04-08: qty 1

## 2018-04-08 MED ORDER — MIDAZOLAM HCL 2 MG/2ML IJ SOLN
INTRAMUSCULAR | Status: AC
  Filled 2018-04-08: qty 2

## 2018-04-08 MED ORDER — CEPHALEXIN 500 MG PO CAPS: 500.0000 mg | ORAL_CAPSULE | Freq: Two times a day (BID) | ORAL | 0 refills | Status: DC

## 2018-04-08 MED ORDER — CHLORHEXIDINE GLUCONATE CLOTH 2 % EX PADS: 6.0000 | MEDICATED_PAD | Freq: Once | CUTANEOUS | Status: DC

## 2018-04-08 MED ORDER — FENTANYL CITRATE (PF) 100 MCG/2ML IJ SOLN
25.0000 ug | INTRAMUSCULAR | Status: DC | PRN
  Administered 2018-04-08: 25 ug via INTRAVENOUS

## 2018-04-08 MED ORDER — LIDOCAINE 2% (20 MG/ML) 5 ML SYRINGE
INTRAMUSCULAR | Status: DC | PRN
  Administered 2018-04-08: 100 mg via INTRAVENOUS

## 2018-04-08 MED ORDER — CEFAZOLIN SODIUM-DEXTROSE 2-4 GM/100ML-% IV SOLN
2.0000 g | INTRAVENOUS | Status: AC
  Administered 2018-04-08: 2 g via INTRAVENOUS
  Filled 2018-04-08: qty 100

## 2018-04-08 MED ORDER — PROMETHAZINE HCL 25 MG/ML IJ SOLN: 6.2500 mg | INTRAMUSCULAR | Status: DC | PRN

## 2018-04-08 MED ORDER — POVIDONE-IODINE 10 % EX OINT
TOPICAL_OINTMENT | CUTANEOUS | Status: DC | PRN
  Administered 2018-04-08: 1 via TOPICAL

## 2018-04-08 MED ORDER — MIDAZOLAM HCL 2 MG/2ML IJ SOLN: 1.0000 mg | INTRAMUSCULAR | Status: DC | PRN

## 2018-04-08 MED ORDER — LIDOCAINE HCL (PF) 1 % IJ SOLN
INTRAMUSCULAR | Status: AC
  Filled 2018-04-08: qty 30

## 2018-04-08 MED ORDER — DEXAMETHASONE SODIUM PHOSPHATE 10 MG/ML IJ SOLN
INTRAMUSCULAR | Status: AC
  Filled 2018-04-08: qty 1

## 2018-04-08 MED ORDER — LACTATED RINGERS IV SOLN
INTRAVENOUS | Status: DC
  Administered 2018-04-08: 07:00:00 via INTRAVENOUS

## 2018-04-08 MED ORDER — BUPIVACAINE-EPINEPHRINE (PF) 0.5% -1:200000 IJ SOLN
INTRAMUSCULAR | Status: AC
  Filled 2018-04-08: qty 30

## 2018-04-08 MED ORDER — SCOPOLAMINE 1 MG/3DAYS TD PT72
1.0000 | MEDICATED_PATCH | Freq: Once | TRANSDERMAL | Status: DC | PRN
  Administered 2018-04-08: 1.5 mg via TRANSDERMAL
  Filled 2018-04-08: qty 1

## 2018-04-08 MED ORDER — FENTANYL CITRATE (PF) 100 MCG/2ML IJ SOLN
INTRAMUSCULAR | Status: DC | PRN
  Administered 2018-04-08 (×2): 50 ug via INTRAVENOUS

## 2018-04-08 MED ORDER — PROPOFOL 10 MG/ML IV BOLUS
INTRAVENOUS | Status: DC | PRN
  Administered 2018-04-08: 200 mg via INTRAVENOUS

## 2018-04-08 MED ORDER — MIDAZOLAM HCL 5 MG/5ML IJ SOLN
INTRAMUSCULAR | Status: DC | PRN
  Administered 2018-04-08: 2 mg via INTRAVENOUS

## 2018-04-08 MED ORDER — FENTANYL CITRATE (PF) 100 MCG/2ML IJ SOLN
INTRAMUSCULAR | Status: AC
  Filled 2018-04-08: qty 2

## 2018-04-08 MED ORDER — BUPIVACAINE-EPINEPHRINE 0.5% -1:200000 IJ SOLN
INTRAMUSCULAR | Status: DC | PRN
  Administered 2018-04-08: 2 mL

## 2018-04-08 MED ORDER — CELECOXIB 200 MG PO CAPS
200.0000 mg | ORAL_CAPSULE | ORAL | Status: AC
  Administered 2018-04-08: 200 mg via ORAL
  Filled 2018-04-08: qty 1

## 2018-04-08 MED ORDER — ONDANSETRON HCL 4 MG/2ML IJ SOLN
INTRAMUSCULAR | Status: AC
  Filled 2018-04-08: qty 2

## 2018-04-08 MED ORDER — ONDANSETRON HCL 4 MG/2ML IJ SOLN
INTRAMUSCULAR | Status: DC | PRN
  Administered 2018-04-08: 4 mg via INTRAVENOUS

## 2018-04-08 MED ORDER — LIDOCAINE 2% (20 MG/ML) 5 ML SYRINGE
INTRAMUSCULAR | Status: AC
  Filled 2018-04-08: qty 5

## 2018-04-08 MED ORDER — ACETAMINOPHEN 500 MG PO TABS
1000.0000 mg | ORAL_TABLET | ORAL | Status: AC
  Administered 2018-04-08: 1000 mg via ORAL
  Filled 2018-04-08: qty 2

## 2018-04-08 MED ORDER — FENTANYL CITRATE (PF) 100 MCG/2ML IJ SOLN: 50.0000 ug | INTRAMUSCULAR | Status: DC | PRN

## 2018-04-08 SURGICAL SUPPLY — 39 items
BENZOIN TINCTURE PRP APPL 2/3 (GAUZE/BANDAGES/DRESSINGS) IMPLANT
BLADE HEX COATED 2.75 (ELECTRODE) ×3 IMPLANT
BLADE SURG 15 STRL LF DISP TIS (BLADE) IMPLANT
BLADE SURG 15 STRL SS (BLADE)
BLADE SURG SZ10 CARB STEEL (BLADE) ×3 IMPLANT
BRIEF STRETCH FOR OB PAD LRG (UNDERPADS AND DIAPERS) ×3 IMPLANT
CLOSURE WOUND 1/2 X4 (GAUZE/BANDAGES/DRESSINGS)
COVER SURGICAL LIGHT HANDLE (MISCELLANEOUS) ×3 IMPLANT
DECANTER SPIKE VIAL GLASS SM (MISCELLANEOUS) ×3 IMPLANT
DRAPE INCISE IOBAN 66X45 STRL (DRAPES) IMPLANT
DRAPE LAPAROTOMY T 102X78X121 (DRAPES) IMPLANT
DRAPE LAPAROTOMY TRNSV 102X78 (DRAPE) IMPLANT
DRAPE SHEET LG 3/4 BI-LAMINATE (DRAPES) IMPLANT
DRSG PAD ABDOMINAL 8X10 ST (GAUZE/BANDAGES/DRESSINGS) IMPLANT
ELECT PENCIL ROCKER SW 15FT (MISCELLANEOUS) ×3 IMPLANT
ELECT REM PT RETURN 15FT ADLT (MISCELLANEOUS) ×3 IMPLANT
EVACUATOR SILICONE 100CC (DRAIN) IMPLANT
GAUZE PACKING IODOFORM 1/4X15 (GAUZE/BANDAGES/DRESSINGS) ×3 IMPLANT
GAUZE SPONGE 4X4 12PLY STRL (GAUZE/BANDAGES/DRESSINGS) ×3 IMPLANT
GLOVE BIOGEL PI IND STRL 7.0 (GLOVE) ×1 IMPLANT
GLOVE BIOGEL PI INDICATOR 7.0 (GLOVE) ×2
GOWN STRL REUS W/TWL LRG LVL3 (GOWN DISPOSABLE) ×3 IMPLANT
GOWN STRL REUS W/TWL XL LVL3 (GOWN DISPOSABLE) ×6 IMPLANT
KIT BASIN OR (CUSTOM PROCEDURE TRAY) ×3 IMPLANT
MARKER SKIN DUAL TIP RULER LAB (MISCELLANEOUS) IMPLANT
NEEDLE HYPO 25X1 1.5 SAFETY (NEEDLE) ×3 IMPLANT
NS IRRIG 1000ML POUR BTL (IV SOLUTION) IMPLANT
PACK BASIC VI WITH GOWN DISP (CUSTOM PROCEDURE TRAY) ×3 IMPLANT
PAD ABD 8X10 STRL (GAUZE/BANDAGES/DRESSINGS) ×3 IMPLANT
SOL PREP POV-IOD 4OZ 10% (MISCELLANEOUS) IMPLANT
SPONGE LAP 18X18 RF (DISPOSABLE) ×3 IMPLANT
SPONGE LAP 4X18 RFD (DISPOSABLE) IMPLANT
STAPLER VISISTAT 35W (STAPLE) IMPLANT
STRIP CLOSURE SKIN 1/2X4 (GAUZE/BANDAGES/DRESSINGS) IMPLANT
SUT PROLENE 4 0 PS 2 18 (SUTURE) ×6 IMPLANT
SUT VIC AB 3-0 SH 18 (SUTURE) IMPLANT
SYR CONTROL 10ML LL (SYRINGE) ×3 IMPLANT
TOWEL OR 17X26 10 PK STRL BLUE (TOWEL DISPOSABLE) ×3 IMPLANT
YANKAUER SUCT BULB TIP 10FT TU (MISCELLANEOUS) ×3 IMPLANT

## 2018-04-08 NOTE — Anesthesia Procedure Notes (Signed)
Procedure Name: LMA Insertion Date/Time: 04/08/2018 8:43 AM Performed by: Epimenio SarinJarvela, Micai Apolinar R, CRNA Pre-anesthesia Checklist: Patient identified, Emergency Drugs available, Suction available, Patient being monitored and Timeout performed Patient Re-evaluated:Patient Re-evaluated prior to induction Oxygen Delivery Method: Circle system utilized Preoxygenation: Pre-oxygenation with 100% oxygen Induction Type: IV induction Ventilation: Mask ventilation without difficulty LMA: LMA with gastric port inserted LMA Size: 4.0 Number of attempts: 1 Dental Injury: Teeth and Oropharynx as per pre-operative assessment

## 2018-04-08 NOTE — Anesthesia Postprocedure Evaluation (Signed)
Anesthesia Post Note  Patient: Cyril LoosenMichael S Tyree  Procedure(s) Performed: Liana GeroldEXCISITON CYST FROM RIGHT BUTTOCKS ERAS PATHWAY (Right )     Patient location during evaluation: PACU Anesthesia Type: General Level of consciousness: sedated Pain management: pain level controlled Vital Signs Assessment: post-procedure vital signs reviewed and stable Respiratory status: spontaneous breathing and respiratory function stable Cardiovascular status: stable Postop Assessment: no apparent nausea or vomiting Anesthetic complications: no    Last Vitals:  Vitals:   04/08/18 1030 04/08/18 1040  BP: 104/65   Pulse: (!) 40   Resp: 12   Temp:  36.5 C  SpO2: 96%     Last Pain:  Vitals:   04/08/18 1030  TempSrc:   PainSc: 3                  Kayelynn Abdou DANIEL

## 2018-04-08 NOTE — Discharge Instructions (Signed)
Take antibiotic as prescribed Take pain med as needed  Call your surgeon if you experience:   1.  Fever over 101.0. 2.  Inability to urinate. 3.  Nausea and/or vomiting. 4.  Extreme swelling or bruising at the surgical site. 5.  Continued bleeding from the incision. 6.  Increased pain, redness or drainage from the incision. 7.  Problems related to your pain medication. 8.  Any problems and/or concerns   Post Anesthesia Home Care Instructions  Activity: Get plenty of rest for the remainder of the day. A responsible individual must stay with you for 24 hours following the procedure.  For the next 24 hours, DO NOT: -Drive a car -Advertising copywriterperate machinery -Drink alcoholic beverages -Take any medication unless instructed by your physician -Make any legal decisions or sign important papers.  Meals: Start with liquid foods such as gelatin or soup. Progress to regular foods as tolerated. Avoid greasy, spicy, heavy foods. If nausea and/or vomiting occur, drink only clear liquids until the nausea and/or vomiting subsides. Call your physician if vomiting continues.  Special Instructions/Symptoms: Your throat may feel dry or sore from the anesthesia or the breathing tube placed in your throat during surgery. If this causes discomfort, gargle with warm salt water. The discomfort should disappear within 24 hours.  If you had a scopolamine patch placed behind your ear for the management of post- operative nausea and/or vomiting:  1. The medication in the patch is effective for 72 hours, after which it should be removed.  Wrap patch in a tissue and discard in the trash. Wash hands thoroughly with soap and water. 2. You may remove the patch earlier than 72 hours if you experience unpleasant side effects which may include dry mouth, dizziness or visual disturbances. 3. Avoid touching the patch. Wash your hands with soap and water after contact with the patch.

## 2018-04-08 NOTE — Progress Notes (Signed)
At 1630[facility] for eval of bleeding form postop site. pt is s/p cyst excision of R buttocks. Pt states "I had a lot of bleeding after arriving home from hospital today, soaking my clothes, bed sheets and towels."   @ 1630 Vs: bp-124/78 T-98.5 P-53 R-18 pulse ox-100%, Moderate amt of bleeding noted, applied clean dressing. Pt states "no dizziness or lightheadness."  @ 1745 Dr Daphine DeutscherMartin evaluated Pts postop site. Small amount of bleeding noted on dressing. Dr Daphine DeutscherMartin gave order to d/c pt home. Pt to f/u in DR office on Friday and call office if has any other concerns.

## 2018-04-08 NOTE — H&P (Signed)
Chief Complaint:  Mass of the buttocks; probable sebaceous cyst  History of Present Illness:  Clinton Allen is an 47 y.o. male with a symptomatic mass of the buttocks  Past Medical History:  Diagnosis Date  . Bipolar 1 disorder (HCC)   . Bronchitis   . Complication of anesthesia    allergy to eggs  . Marijuana abuse    03-2018 smokes daily  . Neck fracture (HCC)   . Vitamin D deficiency     Past Surgical History:  Procedure Laterality Date  . Cotton Osteotomy w/ Bone Graft Left 04/27/2015  . Cotton Osteotomy w/ Bone Graft Right 06/29/2015  . cyst removed from buttocks     04-08-18 Dr. Daphine Deutscher  . Hammer Toe Repair Left 04/27/2015   Lt #2  . HERNIA REPAIR     umbilical  . NECK SURGERY    . TOE SURGERY Bilateral     Current Facility-Administered Medications  Medication Dose Route Frequency Provider Last Rate Last Dose  . ceFAZolin (ANCEF) IVPB 2g/100 mL premix  2 g Intravenous On Call to OR Luretha Murphy, MD      . Chlorhexidine Gluconate Cloth 2 % PADS 6 each  6 each Topical Once Luretha Murphy, MD       And  . Chlorhexidine Gluconate Cloth 2 % PADS 6 each  6 each Topical Once Luretha Murphy, MD      . fentaNYL (SUBLIMAZE) injection 50-100 mcg  50-100 mcg Intravenous PRN Mal Amabile, MD      . lactated ringers infusion   Intravenous Continuous Mal Amabile, MD 10 mL/hr at 04/08/18 [phone]    . midazolam (VERSED) injection 1-2 mg  1-2 mg Intravenous PRN Mal Amabile, MD      . scopolamine (TRANSDERM-SCOP) 1 MG/3DAYS 1.5 mg  1 patch Transdermal Once PRN Mal Amabile, MD   1.5 mg at 04/08/18 0708   Eggs or egg-derived products Family History  Problem Relation Age of Onset  . Heart attack Father   . Hypertension Mother   . Hyperlipidemia Mother   . Hyperkalemia Mother   . Lung cancer Maternal Uncle        smoker   Social History:   reports that he quit smoking about 24 years ago. He has never used smokeless tobacco. He reports that he has current or past drug  history. Drug: Marijuana. He reports that he does not drink alcohol.   REVIEW OF SYSTEMS : Negative except for see problem list  Physical Exam:   Blood pressure 122/76, pulse (!) 44, temperature 97.7 F (36.5 C), temperature source Oral, resp. rate 16, height 5\' 10"  (1.778 m), weight 82.1 kg (181 lb), SpO2 100 %. Body mass index is 25.97 kg/m.  Gen:  WDWN male NAD  Neurological: Alert and oriented to person, place, and time. Motor and sensory function is grossly intact  Head: Normocephalic and atraumatic.  Eyes: Conjunctivae are normal. Pupils are equal, round, and reactive to light. No scleral icterus.  Neck: Normal range of motion. Neck supple. No tracheal deviation or thyromegaly present.  Cardiovascular:  SR without murmurs or gallops.  No carotid bruits Breast:  Not examined Respiratory: Effort normal.  No respiratory distress. No chest wall tenderness. Breath sounds normal.  No wheezes, rales or rhonchi.  Abdomen:  nontender GU:  Mass on the buttocks Musculoskeletal: Normal range of motion. Extremities are nontender. No cyanosis, edema or clubbing noted Lymphadenopathy: No cervical, preauricular, postauricular or axillary adenopathy is present Skin: Skin is warm and dry.  No rash noted. No diaphoresis. No erythema. No pallor. Pscyh: Normal mood and affect. Behavior is normal. Judgment and thought content normal.   LABORATORY RESULTS: No results found for this or any previous visit (from the past 48 hour(s)).   RADIOLOGY RESULTS: No results found.  Problem List: Patient Active Problem List   Diagnosis Date Noted  . Encounter for screening colonoscopy 07/29/2016  . Constipation 07/29/2016  . Abdominal bloating 07/29/2016  . Elevated LFTs 07/29/2016  . Elevated lipase 07/29/2016  . Status post right foot surgery 07/31/2015  . Status post left foot surgery 05/02/2015  . Porokeratosis 04/17/2015  . 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 Pain in lower limb 04/17/2015  . Foot  pain 01/02/2014  . Chronic pain associated with significant psychosocial dysfunction 01/02/2014  . Bipolar I disorder (HCC) 06/30/2013  . History of surgical procedure 06/30/2013  . Cannabis abuse 06/30/2013  . 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, MD, [facility]  [facility] [phone] beeper [phone]  04/08/2018 7:33 AM

## 2018-04-08 NOTE — Transfer of Care (Signed)
Immediate Anesthesia Transfer of Care Note  Patient: Clinton LoosenMichael S Allen  Procedure(s) Performed: Liana GeroldEXCISITON CYST FROM RIGHT BUTTOCKS ERAS PATHWAY (Right )  Patient Location: PACU  Anesthesia Type:General  Level of Consciousness: drowsy and patient cooperative  Airway & Oxygen Therapy: Patient Spontanous Breathing and Patient connected to face mask oxygen  Post-op Assessment: Report given to RN and Post -op Vital signs reviewed and stable  Post vital signs: Reviewed and stable  Last Vitals:  Vitals Value Taken Time  BP    Temp    Pulse 46 04/08/2018  9:49 AM  Resp 13 04/08/2018  9:49 AM  SpO2 100 % 04/08/2018  9:49 AM  Vitals shown include unvalidated device data.  Last Pain:  Vitals:   04/08/18 0716  TempSrc:   PainSc: 0-No pain         Complications: No apparent anesthesia complications

## 2018-04-08 NOTE — Op Note (Signed)
Clinton Allen  1971-08-11 @DATE @   PCP:  Fleet ContrasAvbuere, Edwin, MD   Surgeon: Wenda LowMatt Ahmaya Ostermiller, MD, FACS  Asst:  none  Anes:  general  Preop Dx: Recurrent right buttocks draining sebaceous Postop Dx: Same post excision; partial closure and packing   Procedure: Excision of chronic sebaceous cyst with debridement, closure with vertical mattress sutures of prolene and 1/4 inch packing Location Surgery: [facility] 4 Complications: none  EBL:   minimal cc  Drains: none  Description of Procedure:  The patient was taken to OR 4 .  After anesthesia was administered and the patient was prepped a timeout was performed.  The patient was in dorsal lithotomy and the area that we had seen preop was noted to have a punctum and drainage.  It appeared somewhat decompressed.  An ellipse of skin was created excising the punctum and surrounding skin.  Sharp dissection with a knife and scissors got around the chronically inflamed tissue.  Posteriorly there was some grayish material that was debrided separately.  The resultant cavity was cauterized and closed partially from posterior toward anterior with 4-0 vert mattress sutures of prolene-the most anterior opening was packed with 1/4 iodophor and betadine ointment was placed about the wound  The patient tolerated the procedure well and was taken to the PACU in stable condition.     [name], MD, [facility] [place] [phone]

## 2018-04-08 NOTE — Interval H&P Note (Signed)
History and Physical Interval Note:  04/08/2018 8:30 AM  Clinton Allen  has presented today for surgery, with the diagnosis of sebaceous cyst right buttocks  The various methods of treatment have been discussed with the patient and family. After consideration of risks, benefits and other options for treatment, the patient has consented to  Procedure(s): EXCISITON CYST FROM RIGHT BUTTOCKS ERAS PATHWAY (Right) as a surgical intervention .  The patient's history has been reviewed, patient examined, no change in status, stable for surgery.  I have reviewed the patient's chart and labs.  Questions were answered to the patient's satisfaction.     Valarie MerinoMatthew B Joie Reamer

## 2018-04-09 ENCOUNTER — Encounter (HOSPITAL_COMMUNITY): Payer: Self-pay | Admitting: Surgery

## 2018-09-06 IMAGING — CT CT ABDOMEN W/ CM
2 of 5 series · 16 of 46 positions shown, 18 images · IV contrast (ISOVUE 300)
Comparison: Abdominal ultrasound dated 07/19/2016

CLINICAL DATA: Chronic upper abdominal pain x1 year. Elevated
lipase.

EXAM:
CT ABDOMEN WITH CONTRAST
TECHNIQUE: Multidetector CT imaging of the abdomen was performed using the
standard protocol following bolus administration of intravenous
contrast.
CONTRAST:  100 mL Isovue 300 IV

[Series 2: abd/ pelvis · axial · 0.71mm/px · z∈[-320,-124]mm · 13 of 45 slices shown, 15 images]
[im 3/45  soft-tissue]
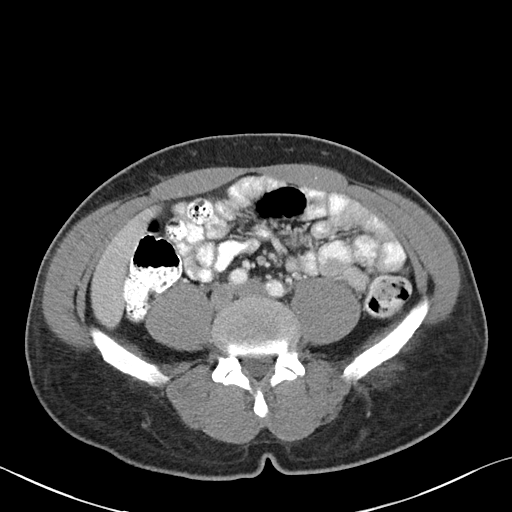
[im 3/45  bone]
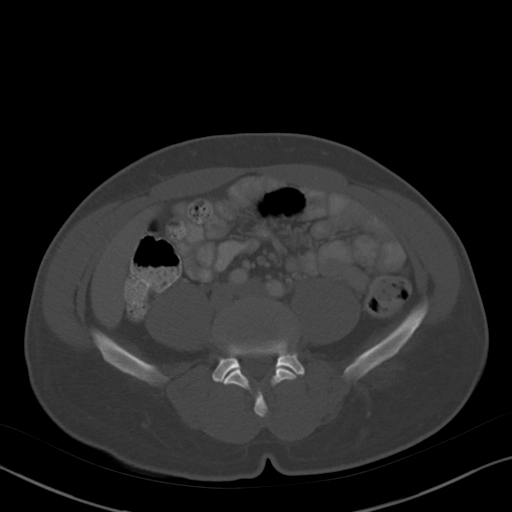
[im 6/45  soft-tissue]
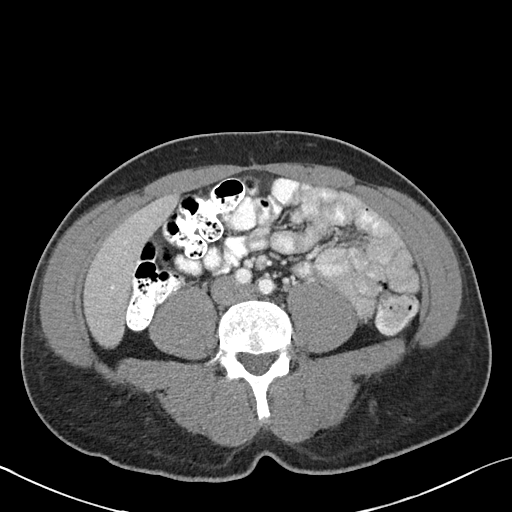
[im 9/45  soft-tissue]
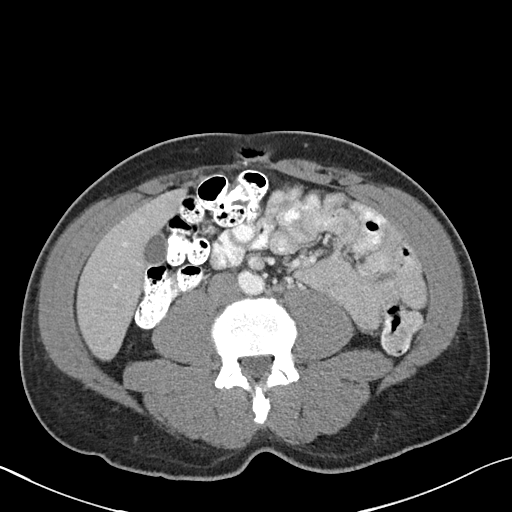
[im 12/45  soft-tissue]
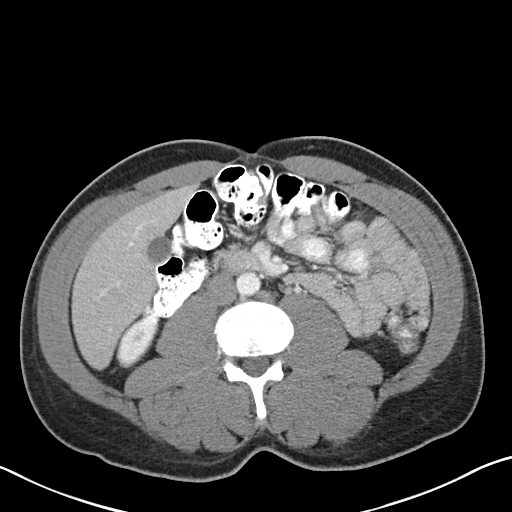
[im 15/45  soft-tissue]
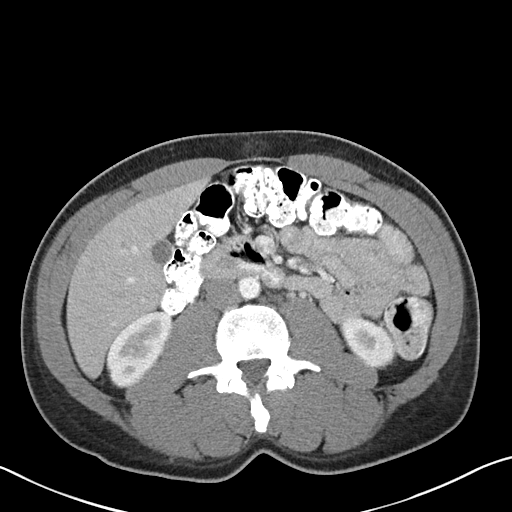
[im 18/45  soft-tissue]
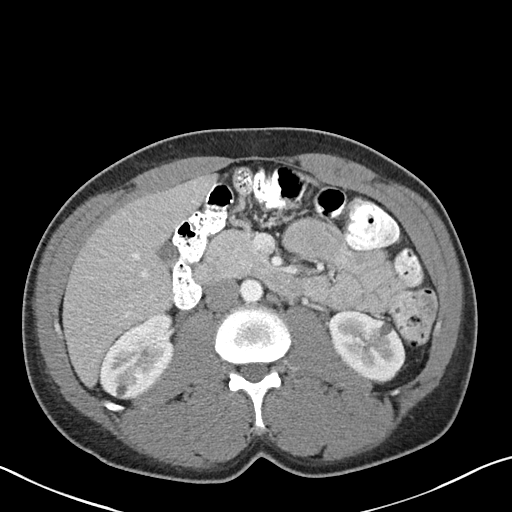
[im 24/45  soft-tissue]
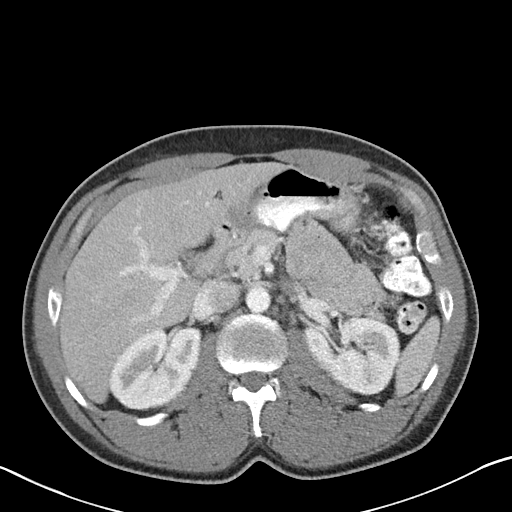
[im 27/45  soft-tissue]
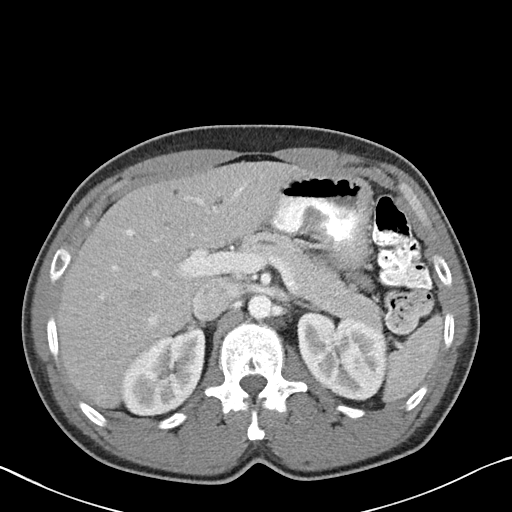
[im 30/45  soft-tissue]
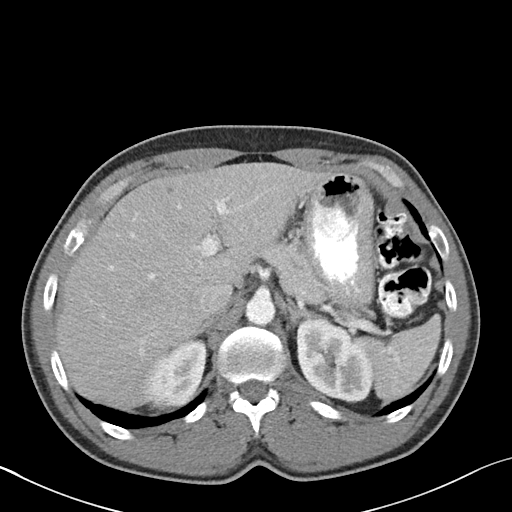
[im 30/45  bone]
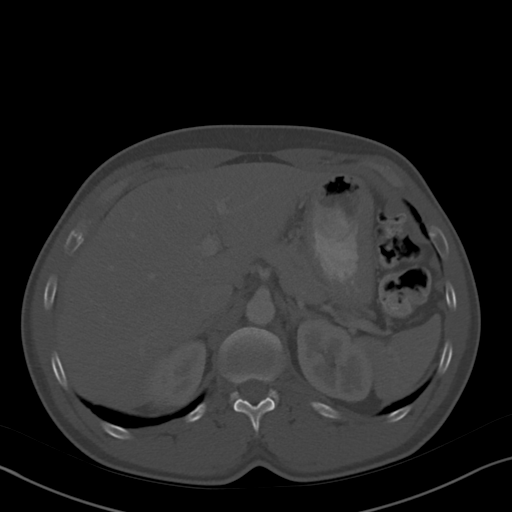
[im 33/45  soft-tissue]
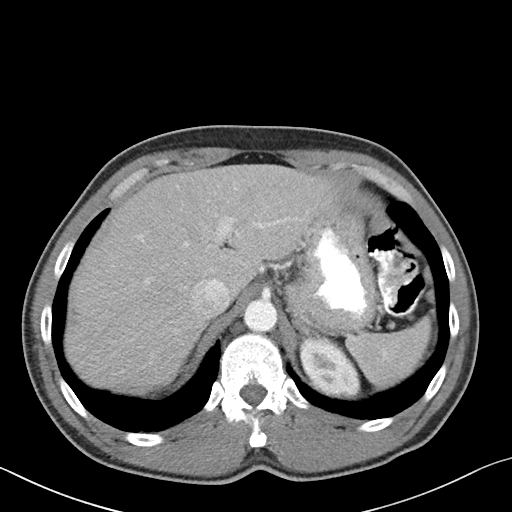
[im 36/45  soft-tissue]
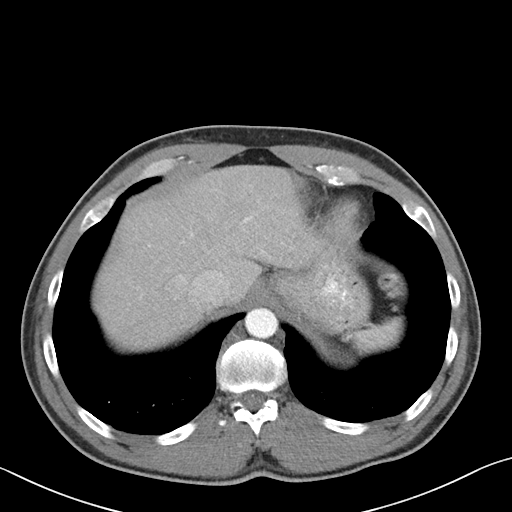
[im 39/45  soft-tissue]
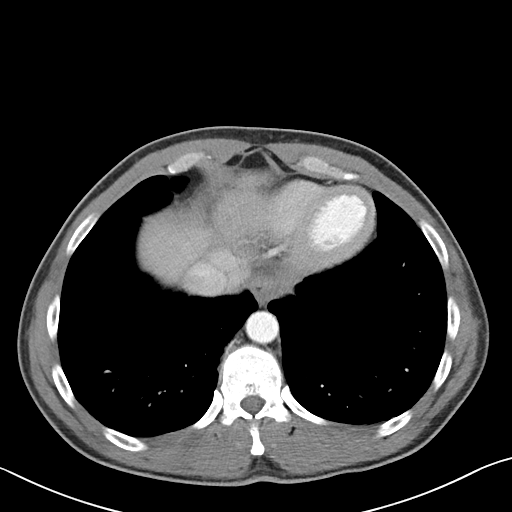
[im 42/45  soft-tissue]
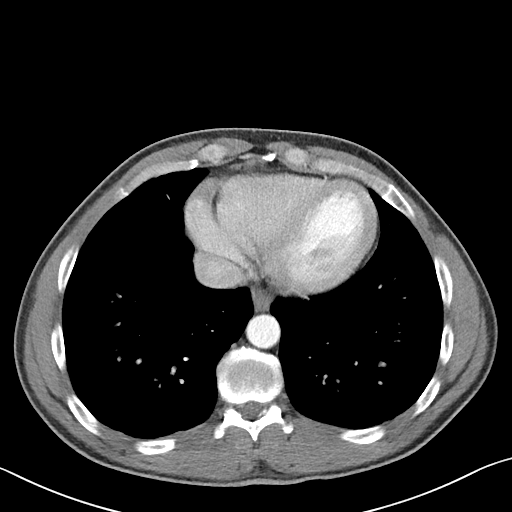

[Series 5: coronal soft tissue · coronal · 0.46mm/px · 3 of 82 slices shown]
[im 28/82  soft-tissue]
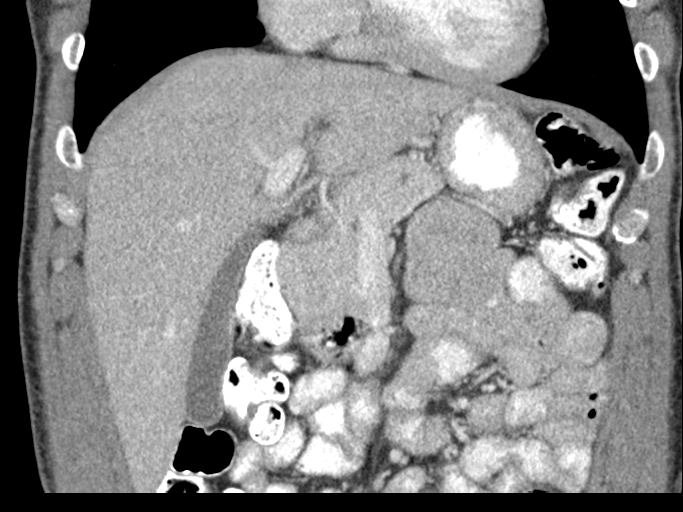
[im 37/82  soft-tissue]
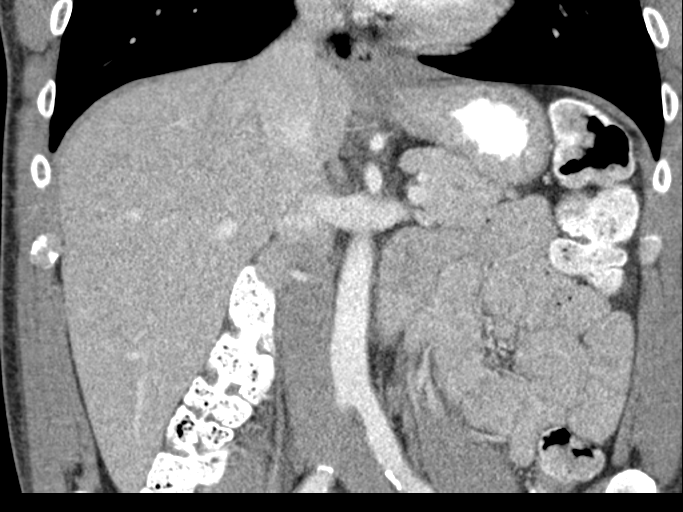
[im 46/82  soft-tissue]
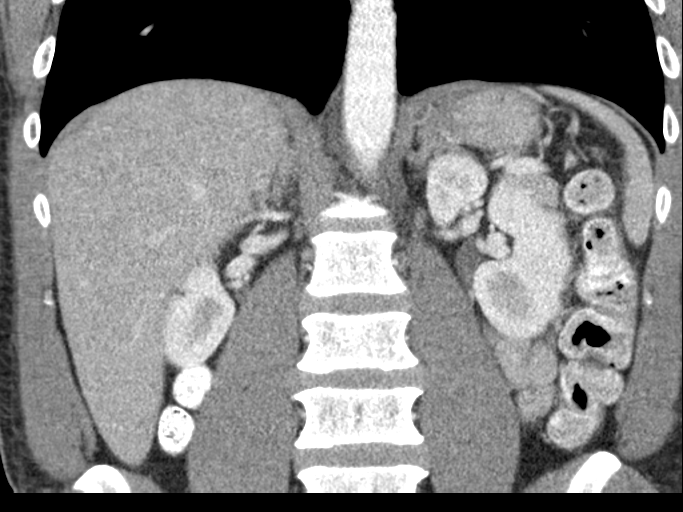

[16 of 46 positions shown; findings below may reference images not displayed]

FINDINGS: Lower chest: Lung bases are clear.

Hepatobiliary: Liver is notable for tiny hepatic cysts measuring up
to 6 mm in segment 4B (series 2/ image 17).

Gallbladder is underdistended but unremarkable. No intrahepatic or
extrahepatic ductal dilatation.

Pancreas: Prominent dorsal drainage of the main pancreatic duct. No
peripancreatic inflammatory changes.

Spleen: Within normal limits.

Adrenals/Urinary Tract: Adrenal glands are within normal limits.

9 mm cyst in the lateral left lower kidney (series 2/image 28). 7 mm
cyst in the posterior right lower kidney (series 2/image 28). No
hydronephrosis.

Stomach/Bowel: Stomach is within normal limits.

Visualized bowel is unremarkable.

Vascular/Lymphatic: No evidence of abdominal aortic aneurysm.

No suspicious abdominal lymphadenopathy.

Other: No abdominal ascites.

Musculoskeletal: No focal osseous lesions.
IMPRESSION: No CT findings of acute pancreatitis. Prominent dorsal drainage of
the main pancreatic duct.

Small bilateral renal cysts measuring up to 9 mm.

No CT findings to account for the patient's upper abdominal pain.

Please note that the pelvis was not imaged.

## 2018-10-26 ENCOUNTER — Ambulatory Visit: Payer: Medicaid Other | Admitting: Physical Therapy

## 2018-10-29 ENCOUNTER — Ambulatory Visit: Payer: Medicaid Other | Attending: Orthopedic Surgery | Admitting: Physical Therapy

## 2018-10-29 ENCOUNTER — Other Ambulatory Visit: Payer: Self-pay

## 2018-10-29 DIAGNOSIS — M25511 Pain in right shoulder: Secondary | ICD-10-CM | POA: Diagnosis not present

## 2018-10-29 DIAGNOSIS — R600 Localized edema: Secondary | ICD-10-CM | POA: Diagnosis present

## 2018-10-29 DIAGNOSIS — G8929 Other chronic pain: Secondary | ICD-10-CM | POA: Insufficient documentation

## 2018-10-29 DIAGNOSIS — M25611 Stiffness of right shoulder, not elsewhere classified: Secondary | ICD-10-CM | POA: Insufficient documentation

## 2018-10-29 NOTE — Therapy (Signed)
[facility] [address] [place], [place], [address] Phone: [phone]   Fax:  [phone]  Physical Therapy Evaluation  Patient Details  Name: Clinton Allen MRN: [id number] Date of Birth: May 25, 1971 Referring Provider (PT): Jones Broom   Encounter Date: 10/29/2018  PT End of Session - 10/29/18 0837    Visit Number  1    Number of Visits  4    Date for PT Re-Evaluation  11/26/18    PT Start Time  0840    PT Stop Time  0907    PT Time Calculation (min)  27 min    Activity Tolerance  Patient tolerated treatment well    Behavior During Therapy  [facility] for tasks assessed/performed       Past Medical History:  Diagnosis Date  . Bipolar 1 disorder (HCC)   . Bronchitis   . Complication of anesthesia    allergy to eggs  . Marijuana abuse    03-2018 smokes daily  . Neck fracture (HCC)   . Vitamin D deficiency     Past Surgical History:  Procedure Laterality Date  . Cotton Osteotomy w/ Bone Graft Left 04/27/2015  . Cotton Osteotomy w/ Bone Graft Right 06/29/2015  . CYST EXCISION Right 04/08/2018   Procedure: EXCISITON CYST FROM RIGHT BUTTOCKS ERAS PATHWAY;  Surgeon: Luretha Murphy, MD;  Location: [facility];  Service: General;  Laterality: Right;  . cyst removed from buttocks     04-08-18 Dr. Daphine Deutscher  . Hammer Toe Repair Left 04/27/2015   Lt #2  . HERNIA REPAIR     umbilical  . NECK SURGERY    . TOE SURGERY Bilateral     There were no vitals filed for this visit.   Subjective Assessment - 10/29/18 0844    Subjective  Patient had right shoulder arthroscopy on 10/05/18 for patient reported SAD, DCE. He also reported some tears and arthritis in the R shoulder. Patient reports intermittent pain in R shoulder. He was trying to work it out on his own. He reports limited ROM and weakness.     Pertinent History  C4 and C5 surgery 2008ish, bipolar disorder, arthritis    Limitations  Other (comment)   cannot sleep  without waking from pain   Patient Stated Goals  to lift one year old, sleep better    Currently in Pain?  Yes    Pain Score  5     Pain Location  Shoulder    Pain Orientation  Right    Pain Descriptors / Indicators  Aching    Pain Type  Surgical pain    Pain Onset  More than a month ago    Pain Frequency  Intermittent    Aggravating Factors   sleeping, cold, liftng    Pain Relieving Factors  no    Effect of Pain on Daily Activities  unable to lift baby         [facility] PT Assessment - 10/29/18 0001      Assessment   Medical Diagnosis  s/p Rt shoulder arthroscopy    Referring Provider (PT)  Jones Broom    Onset Date/Surgical Date  10/05/18    Hand Dominance  Right    Next MD Visit  six weeks      Precautions   Precautions  None      Balance Screen   Has the patient fallen in the past 6 months  No    Has the patient  had a decrease in activity level because of a fear of falling?   No    Is the patient reluctant to leave their home because of a fear of falling?   No      Home Public house manager residence      Prior Function   Level of Independence  Independent    Vocation  Full time employment    Vocation Requirements  loading equipment    Leisure  lifting child      ROM / Strength   AROM / PROM / Strength  AROM;PROM;Strength      AROM   AROM Assessment Site  Shoulder    Right/Left Shoulder  Right    Right Shoulder Flexion  150 Degrees    Right Shoulder ABduction  125 Degrees    Right Shoulder Internal Rotation  65 Degrees    Right Shoulder External Rotation  90 Degrees      PROM   Overall PROM Comments  elbow -17 deg extension    PROM Assessment Site  Shoulder    Right/Left Shoulder  Right    Right Shoulder Flexion  152 Degrees    Right Shoulder ABduction  145 Degrees    Right Shoulder Internal Rotation  72 Degrees      Strength   Strength Assessment Site  Shoulder    Right/Left Shoulder  Right    Right Shoulder Flexion  5/5     Right Shoulder Extension  5/5    Right Shoulder ABduction  5/5    Right Shoulder Internal Rotation  4+/5    Right Shoulder External Rotation  4+/5    Right Shoulder Horizontal ABduction  4+/5    Right Shoulder Horizontal ADduction  4+/5      Palpation   Palpation comment  supraspinatus, along clavicle                Objective measurements completed on examination: See above findings.              PT Education - 10/29/18 0914    Education Details  educated patient on limiting his normal workout at this time to prevent further inflammation of joint.    Person(s) Educated  Patient    Methods  Explanation    Comprehension  Verbalized understanding       PT Short Term Goals - 10/29/18 0918      PT SHORT TERM GOAL #1   Title  Patient to be compliant with HEP.    Time  4    Period  Weeks    Status  New    Target Date  11/26/18      PT SHORT TERM GOAL #2   Title  Patient to report decreased pain in R shoulder with lifiting his baby to 1-2/10 or less.    Time  4    Period  Weeks    Status  New    Target Date  11/26/18      PT SHORT TERM GOAL #3   Title  Patient to demo increased R shoulder flexion to 165 deg or better to normalize ADLS.    Time  4    Period  Weeks    Status  New    Target Date  11/26/18      PT SHORT TERM GOAL #4   Title  Patient able to sleep without waking from pain.    Time  4    Period  Weeks  Status  New    Target Date  11/26/18      PT SHORT TERM GOAL #5   Title  Pt to demo 5/5 R shoulder strength to normalize ADLS    Time  4    Period  Weeks    Status  New    Target Date  11/26/18        PT Long Term Goals - 10/29/18 0924      PT LONG TERM GOAL #1   Title  ALL LTGs = STGs             Plan - 10/29/18 [id number]    Clinical Impression Statement  Patient presents s/p R shoulder arthroscopy on 10/05/18. He states he has been "really working it out" with weights (5 and 10# and pushups). He still has intermittent  pain, decreased strength with IR/ER and limited ROM. PT advised him to stop use of heavy weights and use pain as guide. Patient would like to return to his normal workouts and be able to pick up his one year old child without pain.    History and Personal Factors relevant to plan of care:  cervical surgery for fx, bipolar disorder    Clinical Presentation  Stable    Clinical Decision Making  Low    Rehab Potential  Excellent    PT Frequency  1x / week    PT Duration  4 weeks    PT Treatment/Interventions  Electrical Stimulation;Iontophoresis 4mg /ml Dexamethasone;Moist Heat;Therapeutic exercise;Patient/family education;Neuromuscular re-education;Manual techniques;Passive range of motion    PT Next Visit Plan  R shoulder ROM flex/ABD, [place] 4, scap stab, try ionto for pain    Consulted and Agree with Plan of Care  Patient       Patient will benefit from skilled therapeutic intervention in order to improve the following deficits and impairments:  Pain, Decreased range of motion, Postural dysfunction, Decreased strength  Visit Diagnosis: Chronic right shoulder pain - Plan: PT plan of care cert/re-cert  Stiffness of right shoulder, not elsewhere classified - Plan: PT plan of care cert/re-cert     Problem List Patient Active Problem List   Diagnosis Date Noted  . Encounter for screening colonoscopy 07/29/2016  . Constipation 07/29/2016  . Abdominal bloating 07/29/2016  . Elevated LFTs 07/29/2016  . Elevated lipase 07/29/2016  . Status post right foot surgery 07/31/2015  . Status post left foot surgery 05/02/2015  . Porokeratosis 04/17/2015  . 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 Pain in lower limb 04/17/2015  . Foot pain 01/02/2014  . Chronic pain associated with significant psychosocial dysfunction 01/02/2014  . Bipolar I disorder (HCC) 06/30/2013  . History of surgical procedure 06/30/2013  . Cannabis abuse 06/30/2013  . 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 [place], [place], [address] Phone: [phone]   Fax:  [phone]  Name: Clinton Allen MRN: [id number] Date of Birth: September 15, 1971

## 2018-11-06 ENCOUNTER — Ambulatory Visit: Payer: Medicaid Other | Admitting: Physical Therapy

## 2018-11-06 DIAGNOSIS — G8929 Other chronic pain: Secondary | ICD-10-CM

## 2018-11-06 DIAGNOSIS — M25511 Pain in right shoulder: Principal | ICD-10-CM

## 2018-11-06 DIAGNOSIS — M25611 Stiffness of right shoulder, not elsewhere classified: Secondary | ICD-10-CM

## 2018-11-06 DIAGNOSIS — R600 Localized edema: Secondary | ICD-10-CM

## 2018-11-06 NOTE — Therapy (Signed)
[address] [place], [place], [address] Phone: [phone]   Fax:  [phone]  Physical Therapy Treatment  Patient Details  Name: Clinton Allen MRN: [id number] Date of Birth: [birth year] Referring Provider (PT): Tania Ade   Encounter Date: 11/06/2018  PT End of Session - 11/06/18 0902    Visit Number  2    Number of Visits  4    Date for PT Re-Evaluation  11/26/18    PT Start Time  0830    PT Stop Time  0920    PT Time Calculation (min)  50 min       Past Medical History:  Diagnosis Date  . Bipolar 1 disorder ([place])   . Bronchitis   . Complication of anesthesia    allergy to eggs  . Marijuana abuse    03-2018 smokes daily  . Neck fracture ([place])   . Vitamin D deficiency     Past Surgical History:  Procedure Laterality Date  . Cotton Osteotomy w/ Bone Graft Left 04/27/2015  . Cotton Osteotomy w/ Bone Graft Right 06/29/2015  . CYST EXCISION Right 04/08/2018   Procedure: EXCISITON CYST FROM RIGHT BUTTOCKS ERAS PATHWAY;  Surgeon: Johnathan Hausen, MD;  Location: [facility];  Service: General;  Laterality: Right;  . cyst removed from buttocks     04-08-18 Dr. Hassell Done  . Hammer Toe Repair Left 04/27/2015   Lt #2  . HERNIA REPAIR     umbilical  . NECK SURGERY    . TOE SURGERY Bilateral     There were no vitals filed for this visit.  Subjective Assessment - 11/06/18 0840    Subjective  yesterday no pian but raked leaves and got it sore. pt verb non compliant with HEP " I am doing BIG boy exercises"    Currently in Pain?  Yes    Pain Score  2     Pain Location  Shoulder    Pain Orientation  Right         [facility] PT Assessment - 11/06/18 0001      AROM   AROM Assessment Site  Shoulder    Right/Left Shoulder  Right    Right Shoulder Flexion  170 Degrees    Right Shoulder ABduction  150 Degrees   painful                  [facility] Adult PT Treatment/Exercise - 11/06/18 0001       Exercises   Exercises  Shoulder;Neck      Shoulder Exercises: Standing   Other Standing Exercises  green tband scap stab ex      Shoulder Exercises: ROM/Strengthening   UBE (Upper Arm Bike)  L 3 2 fwd/2 back   postural cuing needed   Lat Pull  --   20# 10 rep,25 # 10 reps- postural cuing needed   Cybex Row  --   25# 2 sets 10 postural cuing     Modalities   Modalities  Vasopneumatic      Vasopneumatic   Number Minutes Vasopneumatic   15 minutes    Vasopnuematic Location   Shoulder    Vasopneumatic Pressure  Medium    Vasopneumatic Temperature   34             PT Education - 11/06/18 0849    Education Details  scap stab green tband    Person(s) Educated  Patient    Methods  Explanation;Demonstration;Verbal cues;Handout  Comprehension  Verbalized understanding;Returned demonstration;Verbal cues required       PT Short Term Goals - 11/06/18 0902      PT SHORT TERM GOAL #1   Title  Patient to be compliant with HEP.    Status  Achieved      PT SHORT TERM GOAL #2   Title  Patient to report decreased pain in R shoulder with lifiting his baby to 1-2/10 or less.    Status  Partially Met      PT SHORT TERM GOAL #3   Title  Patient to demo increased R shoulder flexion to 165 deg or better to normalize ADLS.    Status  Achieved      PT SHORT TERM GOAL #4   Title  Patient able to sleep without waking from pain.    Status  Partially Met      PT SHORT TERM GOAL #5   Title  Pt to demo 5/5 R shoulder strength to normalize ADLS    Status  Partially Met        PT Long Term Goals - 10/29/18 0924      PT LONG TERM GOAL #1   Title  ALL LTGs = STGs            Plan - 11/06/18 0904    Clinical Impression Statement  established and issued HEP . postural cuing and educ needed with ther ex. educ pt on joint safety and protection and recommend he stop his BIG boy ex ( push ups and 15# free wts. Progressing towards goals.)    PT Treatment/Interventions  Electrical  Stimulation;Iontophoresis 75m/ml Dexamethasone;Moist Heat;Therapeutic exercise;Patient/family education;Neuromuscular re-education;Manual techniques;Passive range of motion    PT Next Visit Plan  assess HEP compliance       Patient will benefit from skilled therapeutic intervention in order to improve the following deficits and impairments:  Pain, Decreased range of motion, Postural dysfunction, Decreased strength  Visit Diagnosis: Chronic right shoulder pain  Stiffness of right shoulder, not elsewhere classified  Localized edema     Problem List Patient Active Problem List   Diagnosis Date Noted  . Encounter for screening colonoscopy 07/29/2016  . Constipation 07/29/2016  . Abdominal bloating 07/29/2016  . Elevated LFTs 07/29/2016  . Elevated lipase 07/29/2016  . Status post right foot surgery 07/31/2015  . Status post left foot surgery 05/02/2015  . Porokeratosis 04/17/2015  . 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 Pain in lower limb 04/17/2015  . Foot pain 01/02/2014  . Chronic pain associated with significant psychosocial dysfunction 01/02/2014  . Bipolar I disorder ([place] 06/30/2013  . History of surgical procedure 06/30/2013  . Cannabis abuse 06/30/2013  . 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

## 2018-11-10 ENCOUNTER — Ambulatory Visit: Payer: Medicaid Other

## 2018-11-17 ENCOUNTER — Ambulatory Visit: Payer: Medicaid Other | Attending: Orthopedic Surgery | Admitting: Physical Therapy

## 2019-01-19 ENCOUNTER — Encounter (HOSPITAL_COMMUNITY): Payer: Self-pay | Admitting: *Deleted

## 2019-01-19 ENCOUNTER — Emergency Department (HOSPITAL_COMMUNITY)
Admission: EM | Admit: 2019-01-19 | Discharge: 2019-01-19 | Disposition: A | Discharge status: Home / Self Care | Payer: Medicaid Other | Attending: Emergency Medicine | Admitting: Emergency Medicine

## 2019-01-19 ENCOUNTER — Other Ambulatory Visit: Payer: Self-pay

## 2019-01-19 DIAGNOSIS — J069 Acute upper respiratory infection, unspecified: Secondary | ICD-10-CM | POA: Insufficient documentation

## 2019-01-19 DIAGNOSIS — Z87891 Personal history of nicotine dependence: Secondary | ICD-10-CM | POA: Diagnosis not present

## 2019-01-19 DIAGNOSIS — J029 Acute pharyngitis, unspecified: Secondary | ICD-10-CM | POA: Diagnosis present

## 2019-01-19 DIAGNOSIS — Z79899 Other long term (current) drug therapy: Secondary | ICD-10-CM | POA: Insufficient documentation

## 2019-01-19 DIAGNOSIS — B9789 Other viral agents as the cause of diseases classified elsewhere: Secondary | ICD-10-CM

## 2019-01-19 NOTE — Discharge Instructions (Signed)
Stay hydrated.   Take tylenol, motrin for sore throat, fever.   See your doctor  Return to ER if you have worse sore throat, trouble swallowing, fever > 101.

## 2019-01-19 NOTE — ED Notes (Signed)
Patient is in Peds w/ his daughter.

## 2019-01-19 NOTE — ED Triage Notes (Signed)
Complaining of sore throat for two weeks, no fever, no meds taken. No one else is sick, he does have a cough. No flu shot, he is allergic to eggs

## 2019-01-19 NOTE — ED Provider Notes (Signed)
[facility] EMERGENCY DEPARTMENT Provider Note   CSN: [id number] Arrival date & time: 01/19/19  2119     History   Chief Complaint No chief complaint on file.   HPI Clinton Allen is a 48 y.o. male hx of bipolar, here presenting with sore throat, cough.  Patient has been sick for the last 2 weeks.  Patient has intermittent sore throat and nonproductive cough.  No fevers at home.  Patient denies any sick contacts.  Patient denies any trouble swallowing.   The history is provided by the patient.    Past Medical History:  Diagnosis Date  . Bipolar 1 disorder ([facility])   . Bronchitis   . Complication of anesthesia    allergy to eggs  . Marijuana abuse    03-2018 smokes daily  . Neck fracture ([facility])   . Vitamin D deficiency     Patient Active Problem List   Diagnosis Date Noted  . Encounter for screening colonoscopy 07/29/2016  . Constipation 07/29/2016  . Abdominal bloating 07/29/2016  . Elevated LFTs 07/29/2016  . Elevated lipase 07/29/2016  . Status post right foot surgery 07/31/2015  . Status post left foot surgery 05/02/2015  . Porokeratosis 04/17/2015  . 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 Pain in lower limb 04/17/2015  . Foot pain 01/02/2014  . Chronic pain associated with significant psychosocial dysfunction 01/02/2014  . Bipolar I disorder ([facility]) 06/30/2013  . History of surgical procedure 06/30/2013  . Cannabis abuse 06/30/2013  . 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 Cotton Osteotomy w/ Bone Graft Left 04/27/2015  . Cotton Osteotomy w/ Bone Graft Right 06/29/2015  . CYST EXCISION Right 04/08/2018   Procedure: EXCISITON CYST FROM RIGHT BUTTOCKS ERAS PATHWAY;  Surgeon: Luretha Murphy, MD;  Location: [facility];  Service: General;  Laterality: Right;  . cyst removed from buttocks     04-08-18 Dr. Daphine Deutscher  . Hammer Toe Repair Left 04/27/2015   Lt #2  . HERNIA REPAIR     umbilical  . NECK  SURGERY    . TOE SURGERY Bilateral         Home Medications    Prior to Admission medications   Medication Sig Start Date End Date Taking? Authorizing Provider  albuterol (PROVENTIL HFA;VENTOLIN HFA) 108 (90 Base) MCG/ACT inhaler Inhale 2 puffs into the lungs every 6 (six) hours as needed for wheezing or shortness of breath. 06/28/16   Derwood Kaplan, MD  AMBULATORY NON FORMULARY MEDICATION Testosterone Booster I tab by mouth every day    [provider]  AMBULATORY NON FORMULARY MEDICATION Pre-workout N.O. Fury 1 tab by mouth every day    [provider]  cephALEXin (KEFLEX) 500 MG capsule Take 1 capsule (500 mg total) by mouth 2 (two) times daily. Patient not taking: Reported on 10/29/2018 04/08/18   Luretha Murphy, MD  clotrimazole-betamethasone (LOTRISONE) cream Apply 1 application topically 2 (two) times daily. 03/09/18   [provider]  griseofulvin (GRIS-PEG) 250 MG tablet Take 250 mg by mouth daily. 03/09/18   [provider]  HYDROcodone-acetaminophen (NORCO/VICODIN) 5-325 MG tablet Take 1 tablet by mouth every 6 (six) hours as needed for moderate pain. Patient not taking: Reported on 10/29/2018 04/08/18   Luretha Murphy, MD  loratadine (CLARITIN) 10 MG tablet Take 10 mg by mouth daily as needed for allergies.    [provider]  sildenafil (VIAGRA) 100 MG tablet Take 100 mg by mouth daily as  needed for erectile dysfunction.    [provider]    Family History Family History  Problem Relation Age of Onset  . Heart attack Father   . Hypertension Mother   . Hyperlipidemia Mother   . Hyperkalemia Mother   . Lung cancer Maternal Uncle        smoker    Social History Social History   Tobacco Use  . Smoking status: Former Smoker    Last attempt to quit: 12/16/1993    Years since quitting: 25.1  . Smokeless tobacco: Never Used  Substance Use Topics  . Alcohol use: No  . Drug use: Yes    Types: Marijuana    Comment:  03-2018 smokes daily     Allergies   Eggs or egg-derived products   Review of Systems Review of Systems  HENT: Positive for sore throat.   Respiratory: Positive for cough.   All other systems reviewed and are negative.    Physical Exam Updated Vital Signs There were no vitals taken for this visit.  Physical Exam Vitals signs reviewed.  Constitutional:      Appearance: He is well-developed.  HENT:     Head: Normocephalic.     Right Ear: Tympanic membrane normal.     Left Ear: Tympanic membrane normal.     Mouth/Throat:     Mouth: Mucous membranes are moist.     Tonsils: No tonsillar exudate or tonsillar abscesses.     Comments: OP slightly red, no tonsillar exudates  Eyes:     Conjunctiva/sclera: Conjunctivae normal.  Neck:     Musculoskeletal: Normal range of motion and neck supple.     Comments: No cervical LAD  Cardiovascular:     Rate and Rhythm: Normal rate and regular rhythm.     Heart sounds: Normal heart sounds.  Pulmonary:     Effort: Pulmonary effort is normal.     Breath sounds: Normal breath sounds.  Abdominal:     Palpations: Abdomen is soft.  Skin:    General: Skin is warm.     Capillary Refill: Capillary refill takes less than 2 seconds.  Neurological:     General: No focal deficit present.     Mental Status: He is alert and oriented to person, place, and time.  Psychiatric:        Mood and Affect: Mood normal.        Behavior: Behavior normal.      ED Treatments / Results  Labs (all labs ordered are listed, but only abnormal results are displayed) Labs Reviewed - No data to display  EKG None  Radiology No results found.  Procedures Procedures (including critical care time)  Medications Ordered in ED Medications - No data to display   Initial Impression / Assessment and Plan / ED Course  I have reviewed the triage vital signs and the nursing notes.  Pertinent labs & imaging results that were available during my care of the  patient were reviewed by me and considered in my medical decision making (see chart for details).    Clinton Allen is a 48 y.o. male here with sore throat, cough. Low centor score. I think likely viral URI with cough. I doubt strep and patient is afebrile and well hydrated. Stable for discharge. Gave strict return precautions.    Final Clinical Impressions(s) / ED Diagnoses   Final diagnoses:  None    ED Discharge Orders    None       Chaney MallingYao, Anamarie Hunn  Hsienta, MD 01/19/19 [id number]

## 2019-03-04 ENCOUNTER — Emergency Department (HOSPITAL_COMMUNITY)
Admission: EM | Admit: 2019-03-04 | Discharge: 2019-03-04 | Disposition: A | Discharge status: Home / Self Care | Payer: Medicaid Other | Attending: Emergency Medicine | Admitting: Emergency Medicine

## 2019-03-04 ENCOUNTER — Encounter (HOSPITAL_COMMUNITY): Payer: Self-pay

## 2019-03-04 ENCOUNTER — Other Ambulatory Visit: Payer: Self-pay

## 2019-03-04 DIAGNOSIS — Z79899 Other long term (current) drug therapy: Secondary | ICD-10-CM | POA: Diagnosis not present

## 2019-03-04 DIAGNOSIS — J029 Acute pharyngitis, unspecified: Secondary | ICD-10-CM | POA: Insufficient documentation

## 2019-03-04 DIAGNOSIS — Z87891 Personal history of nicotine dependence: Secondary | ICD-10-CM | POA: Insufficient documentation

## 2019-03-04 MED ORDER — AMOXICILLIN 875 MG PO TABS: 875.0000 mg | ORAL_TABLET | Freq: Two times a day (BID) | ORAL | 0 refills | Status: DC

## 2019-03-04 NOTE — Discharge Instructions (Addendum)
Return here as needed.  Increase your fluid intake.  Rest as much as possible.  Tylenol and Motrin for any pain.

## 2019-03-04 NOTE — ED Provider Notes (Signed)
[facility] EMERGENCY DEPARTMENT Provider Note   CSN: [id number] Arrival date & time: 03/04/19  1859    History   Chief Complaint Chief Complaint  Patient presents with  . Sore Throat    HPI Clinton Allen is a 48 y.o. male.     HPI Patient presents to the emergency department with sore throat that started 3 days ago.  The patient states that his children had strep pharyngitis.  The patient states that he has increasing pain with swallowing.  Patient states that he did not take any medications prior to arrival.  The patient denies chest pain, shortness of breath, headache,blurred vision, neck pain, fever, cough, weakness, numbness, dizziness, anorexia, edema, abdominal pain, nausea, vomiting, diarrhea, rash, back pain, dysuria, hematemesis, bloody stool, near syncope, or syncope. Past Medical History:  Diagnosis Date  . Bipolar 1 disorder ([facility])   . Bronchitis   . Complication of anesthesia    allergy to eggs  . Marijuana abuse    03-2018 smokes daily  . Neck fracture ([facility])   . Vitamin D deficiency     Patient Active Problem List   Diagnosis Date Noted  . Encounter for screening colonoscopy 07/29/2016  . Constipation 07/29/2016  . Abdominal bloating 07/29/2016  . Elevated LFTs 07/29/2016  . Elevated lipase 07/29/2016  . Status post right foot surgery 07/31/2015  . Status post left foot surgery 05/02/2015  . Porokeratosis 04/17/2015  . 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 Pain in lower limb 04/17/2015  . Foot pain 01/02/2014  . Chronic pain associated with significant psychosocial dysfunction 01/02/2014  . Bipolar I disorder ([facility]) 06/30/2013  . History of surgical procedure 06/30/2013  . Cannabis abuse 06/30/2013  . 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 Cotton Osteotomy w/ Bone Graft Left 04/27/2015  . Cotton Osteotomy w/ Bone Graft Right 06/29/2015  . CYST EXCISION Right 04/08/2018   Procedure: EXCISITON CYST FROM RIGHT BUTTOCKS ERAS PATHWAY;  Surgeon: Luretha Murphy, MD;  Location: [facility];  Service: General;  Laterality: Right;  . cyst removed from buttocks     04-08-18 Dr. Daphine Deutscher  . Hammer Toe Repair Left 04/27/2015   Lt #2  . HERNIA REPAIR     umbilical  . NECK SURGERY    . TOE SURGERY Bilateral         Home Medications    Prior to Admission medications   Medication Sig Start Date End Date Taking? Authorizing Provider  albuterol (PROVENTIL HFA;VENTOLIN HFA) 108 (90 Base) MCG/ACT inhaler Inhale 2 puffs into the lungs every 6 (six) hours as needed for wheezing or shortness of breath. 06/28/16   Derwood Kaplan, MD  AMBULATORY NON FORMULARY MEDICATION Testosterone Booster I tab by mouth every day    [provider]  AMBULATORY NON FORMULARY MEDICATION Pre-workout N.O. Fury 1 tab by mouth every day    [provider]  cephALEXin (KEFLEX) 500 MG capsule Take 1 capsule (500 mg total) by mouth 2 (two) times daily. Patient not taking: Reported on 10/29/2018 04/08/18   Luretha Murphy, MD  clotrimazole-betamethasone (LOTRISONE) cream Apply 1 application topically 2 (two) times daily. 03/09/18   [provider]  griseofulvin (GRIS-PEG) 250 MG tablet Take 250 mg by mouth daily. 03/09/18   [provider]  HYDROcodone-acetaminophen (NORCO/VICODIN) 5-325 MG tablet Take 1 tablet by mouth every 6 (six) hours as needed for moderate pain. Patient not taking: Reported on 10/29/2018 04/08/18   Luretha Murphy,  MD  loratadine (CLARITIN) 10 MG tablet Take 10 mg by mouth daily as needed for allergies.    [provider]  sildenafil (VIAGRA) 100 MG tablet Take 100 mg by mouth daily as needed for erectile dysfunction.    [provider]    Family History Family History  Problem Relation Age of Onset  . Heart attack Father   . Hypertension Mother   . Hyperlipidemia Mother   . Hyperkalemia Mother   . Lung cancer Maternal Uncle         smoker    Social History Social History   Tobacco Use  . Smoking status: Former Smoker    Last attempt to quit: 12/16/1993    Years since quitting: 25.2  . Smokeless tobacco: Never Used  Substance Use Topics  . Alcohol use: No  . Drug use: Yes    Types: Marijuana    Comment: 03-2018 smokes daily     Allergies   Eggs or egg-derived products   Review of Systems Review of Systems All other systems negative except as documented in the HPI. All pertinent positives and negatives as reviewed in the HPI.  Physical Exam Updated Vital Signs BP 135/82 (BP Location: Right Arm)   Pulse (!) 45   Temp 98.1 F (36.7 C) (Oral)   Resp 16   Ht 5\' 11"  (1.803 m)   Wt 84.4 kg   SpO2 99%   BMI 25.94 kg/m   Physical Exam Vitals signs and nursing note reviewed.  Constitutional:      General: He is not in acute distress.    Appearance: He is well-developed.  HENT:     Head: Normocephalic and atraumatic.     Right Ear: Tympanic membrane normal.     Left Ear: Tympanic membrane normal.     Mouth/Throat:     Mouth: No oral lesions.     Pharynx: Uvula midline. Posterior oropharyngeal erythema present. No pharyngeal swelling, oropharyngeal exudate or uvula swelling.  Eyes:     Conjunctiva/sclera: Conjunctivae normal.     Pupils: Pupils are equal, round, and reactive to light.  Cardiovascular:     Rate and Rhythm: Normal rate.  Pulmonary:     Effort: Pulmonary effort is normal. No respiratory distress.     Breath sounds: No stridor. No wheezing or rhonchi.  Skin:    General: Skin is warm and dry.  Neurological:     Mental Status: He is alert and oriented to person, place, and time.      ED Treatments / Results  Labs (all labs ordered are listed, but only abnormal results are displayed) Labs Reviewed  GROUP A STREP BY PCR    EKG None  Radiology No results found.  Procedures Procedures (including critical care time)  Medications Ordered in ED Medications - No data to  display   Initial Impression / Assessment and Plan / ED Course  I have reviewed the triage vital signs and the nursing notes.  Pertinent labs & imaging results that were available during my care of the patient were reviewed by me and considered in my medical decision making (see chart for details).        Patient will be treated for pharyngitis.  To return here as needed.  Patient is advised to increase his fluid intake.  Final Clinical Impressions(s) / ED Diagnoses   Final diagnoses:  None    ED Discharge Orders    None       Charlestine NightLawyer, Kolbie Clarkston, PA-C 03/04/19  [id number]    Shaune Pollack, MD 03/04/19 [phone]

## 2019-03-04 NOTE — ED Notes (Signed)
Pt discharged from ED; instructions provided and scripts given; Pt encouraged to return to ED if symptoms worsen and to f/u with PCP; Pt verbalized understanding of all instructions 

## 2019-03-04 NOTE — ED Triage Notes (Signed)
Pt arrived with c/o sore throat x3 days; pt states that his kids recently dx with strep throat and he wanted to be chkd out.

## 2019-07-06 ENCOUNTER — Other Ambulatory Visit: Payer: Self-pay | Admitting: *Deleted

## 2019-07-06 DIAGNOSIS — Z20822 Contact with and (suspected) exposure to covid-19: Secondary | ICD-10-CM

## 2019-07-09 LAB — NOVEL CORONAVIRUS, NAA: SARS-CoV-2, NAA: NOT DETECTED

## 2019-07-20 ENCOUNTER — Emergency Department (HOSPITAL_COMMUNITY): Payer: Medicaid Other

## 2019-07-20 ENCOUNTER — Emergency Department (HOSPITAL_COMMUNITY)
Admission: EM | Admit: 2019-07-20 | Discharge: 2019-07-20 | Disposition: A | Discharge status: Home / Self Care | Payer: Medicaid Other | Attending: Emergency Medicine | Admitting: Emergency Medicine

## 2019-07-20 ENCOUNTER — Other Ambulatory Visit: Payer: Self-pay

## 2019-07-20 ENCOUNTER — Encounter (HOSPITAL_COMMUNITY): Payer: Self-pay | Admitting: Emergency Medicine

## 2019-07-20 DIAGNOSIS — R1031 Right lower quadrant pain: Secondary | ICD-10-CM | POA: Insufficient documentation

## 2019-07-20 DIAGNOSIS — F319 Bipolar disorder, unspecified: Secondary | ICD-10-CM | POA: Diagnosis not present

## 2019-07-20 DIAGNOSIS — Z79899 Other long term (current) drug therapy: Secondary | ICD-10-CM | POA: Insufficient documentation

## 2019-07-20 DIAGNOSIS — M542 Cervicalgia: Secondary | ICD-10-CM | POA: Insufficient documentation

## 2019-07-20 DIAGNOSIS — F1290 Cannabis use, unspecified, uncomplicated: Secondary | ICD-10-CM | POA: Diagnosis not present

## 2019-07-20 DIAGNOSIS — M545 Low back pain, unspecified: Secondary | ICD-10-CM

## 2019-07-20 DIAGNOSIS — Z87891 Personal history of nicotine dependence: Secondary | ICD-10-CM | POA: Diagnosis not present

## 2019-07-20 DIAGNOSIS — R109 Unspecified abdominal pain: Secondary | ICD-10-CM

## 2019-07-20 LAB — URINALYSIS, ROUTINE W REFLEX MICROSCOPIC
Bacteria, UA: NONE SEEN
Bilirubin Urine: NEGATIVE
Glucose, UA: NEGATIVE mg/dL
Ketones, ur: NEGATIVE mg/dL
Leukocytes,Ua: NEGATIVE
Nitrite: NEGATIVE
Protein, ur: NEGATIVE mg/dL
Specific Gravity, Urine: 1.026 (ref 1.005–1.030)
pH: 5 (ref 5.0–8.0)

## 2019-07-20 LAB — CBC WITH DIFFERENTIAL/PLATELET
Abs Immature Granulocytes: 0.02 10*3/uL (ref 0.00–0.07)
Basophils Absolute: 0 10*3/uL (ref 0.0–0.1)
Basophils Relative: 1 %
Eosinophils Absolute: 0.1 10*3/uL (ref 0.0–0.5)
Eosinophils Relative: 3 %
HCT: 42.3 % (ref 39.0–52.0)
Hemoglobin: 13.5 g/dL (ref 13.0–17.0)
Immature Granulocytes: 0 %
Lymphocytes Relative: 41 %
Lymphs Abs: 1.9 10*3/uL (ref 0.7–4.0)
MCH: 31.6 pg (ref 26.0–34.0)
MCHC: 31.9 g/dL (ref 30.0–36.0)
MCV: 99.1 fL (ref 80.0–100.0)
Monocytes Absolute: 0.5 10*3/uL (ref 0.1–1.0)
Monocytes Relative: 10 %
Neutro Abs: 2.1 10*3/uL (ref 1.7–7.7)
Neutrophils Relative %: 45 %
Platelets: 175 10*3/uL (ref 150–400)
RBC: 4.27 MIL/uL (ref 4.22–5.81)
RDW: 12.4 % (ref 11.5–15.5)
WBC: 4.6 10*3/uL (ref 4.0–10.5)
nRBC: 0 % (ref 0.0–0.2)

## 2019-07-20 LAB — COMPREHENSIVE METABOLIC PANEL
ALT: 19 U/L (ref 0–44)
AST: 22 U/L (ref 15–41)
Albumin: 3.6 g/dL (ref 3.5–5.0)
Alkaline Phosphatase: 82 U/L (ref 38–126)
Anion gap: 6 (ref 5–15)
BUN: 14 mg/dL (ref 6–20)
CO2: 28 mmol/L (ref 22–32)
Calcium: 8.8 mg/dL — ABNORMAL LOW (ref 8.9–10.3)
Chloride: 107 mmol/L (ref 98–111)
Creatinine, Ser: 1.12 mg/dL (ref 0.61–1.24)
GFR calc Af Amer: >60 mL/min (ref >60)
GFR calc non Af Amer: >60 mL/min (ref >60)
Glucose, Bld: 78 mg/dL (ref 70–99)
Potassium: 4.5 mmol/L (ref 3.5–5.1)
Sodium: 141 mmol/L (ref 135–145)
Total Bilirubin: 0.7 mg/dL (ref 0.3–1.2)
Total Protein: 5.7 g/dL — ABNORMAL LOW (ref 6.5–8.1)

## 2019-07-20 MED ORDER — CYCLOBENZAPRINE HCL 10 MG PO TABS: 10.0000 mg | ORAL_TABLET | Freq: Every evening | ORAL | 0 refills | Status: DC | PRN

## 2019-07-20 MED ORDER — MELOXICAM 15 MG PO TABS: 15.0000 mg | ORAL_TABLET | Freq: Every day | ORAL | 0 refills | Status: DC

## 2019-07-20 NOTE — Discharge Planning (Signed)
Follow for disposition needs.  

## 2019-07-20 NOTE — ED Provider Notes (Signed)
[facility]   CSN: [id number] Arrival date & time: 07/20/19  1105     History   Chief Complaint Chief Complaint  Patient presents with  . Flank Pain    HPI Clinton Allen is a 48 y.o. male who presents with low back pain and abdominal pain.  Past medical history significant for bipolar disorder, history of bronchitis, marijuana use, chronic neck pain.  Patient states that for the past 3 to 4 days he has had intermittent right-sided flank pain radiating to the abdomen.  It started one morning when he got up out of bed and he states he took a pill for it and then mowed the grass and it went away.  The pain keeps coming back, however. The pain is severe in nature and he has trouble characterizing it.  The pain comes and goes.  It's worse when he has to get up.  It is better with rest.  He states that he is not sure if his abdomen is hurting or not but it feels tight.  He denies fever, chills, chest pain, shortness of breath, nausea, vomiting, diarrhea.  He has some constipation and urinary frequency but otherwise no hematuria, dysuria, penile or testicular pain.  He is never had a kidney stone denies history of chronic back pain.  He denies any heavy lifting. No radicular pain, syncope, trauma, unexplained weight loss, hx of cancer, loss of bowel/bladder function, saddle anesthesia, urinary retention, IVDU.  He is also having some right-sided neck pain but states that this is a chronic issue for him and he thinks it is because he slept funny from his back hurting.  HPI  Past Medical History:  Diagnosis Date  . Bipolar 1 disorder (HCC)   . Bronchitis   . Complication of anesthesia    allergy to eggs  . Marijuana abuse    03-2018 smokes daily  . Neck fracture (HCC)   . Vitamin D deficiency     Patient Active Problem List   Diagnosis Date Noted  . Encounter for screening colonoscopy 07/29/2016  . Constipation 07/29/2016  . Abdominal  bloating 07/29/2016  . Elevated LFTs 07/29/2016  . Elevated lipase 07/29/2016  . Status post right foot surgery 07/31/2015  . Status post left foot surgery 05/02/2015  . Porokeratosis 04/17/2015  . 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 Pain in lower limb 04/17/2015  . Foot pain 01/02/2014  . Chronic pain associated with significant psychosocial dysfunction 01/02/2014  . Bipolar I disorder (HCC) 06/30/2013  . History of surgical procedure 06/30/2013  . Cannabis abuse 06/30/2013  . 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 Cotton Osteotomy w/ Bone Graft Left 04/27/2015  . Cotton Osteotomy w/ Bone Graft Right 06/29/2015  . CYST EXCISION Right 04/08/2018   Procedure: EXCISITON CYST FROM RIGHT BUTTOCKS ERAS PATHWAY;  Surgeon: Luretha MurphyMartin, Matthew, MD;  Location: [facility];  Service: General;  Laterality: Right;  . cyst removed from buttocks     04-08-18 Dr. Daphine DeutscherMartin  . Hammer Toe Repair Left 04/27/2015   Lt #2  . HERNIA REPAIR     umbilical  . NECK SURGERY    . TOE SURGERY Bilateral         Home Medications    Prior to Admission medications   Medication Sig Start Date End Date Taking? Authorizing Provider  albuterol (PROVENTIL HFA;VENTOLIN HFA) 108 (90 Base) MCG/ACT inhaler Inhale 2 puffs into the lungs  every 6 (six) hours as needed for wheezing or shortness of breath. 06/28/16   Varney Biles, MD  AMBULATORY NON FORMULARY MEDICATION Testosterone Booster I tab by mouth every day    [provider]  AMBULATORY NON FORMULARY MEDICATION Pre-workout N.O. Fury 1 tab by mouth every day    [provider]  amoxicillin (AMOXIL) 875 MG tablet Take 1 tablet (875 mg total) by mouth 2 (two) times daily. 03/04/19   Lawyer, Harrell Gave, PA-C  cephALEXin (KEFLEX) 500 MG capsule Take 1 capsule (500 mg total) by mouth 2 (two) times daily. Patient not taking: Reported on 10/29/2018 04/08/18   Johnathan Hausen, MD  clotrimazole-betamethasone  (LOTRISONE) cream Apply 1 application topically 2 (two) times daily. 03/09/18   [provider]  griseofulvin (GRIS-PEG) 250 MG tablet Take 250 mg by mouth daily. 03/09/18   [provider]  HYDROcodone-acetaminophen (NORCO/VICODIN) 5-325 MG tablet Take 1 tablet by mouth every 6 (six) hours as needed for moderate pain. Patient not taking: Reported on 10/29/2018 04/08/18   Johnathan Hausen, MD  loratadine (CLARITIN) 10 MG tablet Take 10 mg by mouth daily as needed for allergies.    [provider]  sildenafil (VIAGRA) 100 MG tablet Take 100 mg by mouth daily as needed for erectile dysfunction.    [provider]    Family History Family History  Problem Relation Age of Onset  . Heart attack Father   . Hypertension Mother   . Hyperlipidemia Mother   . Hyperkalemia Mother   . Lung cancer Maternal Uncle        smoker    Social History Social History   Tobacco Use  . Smoking status: Former Smoker    Quit date: 12/16/1993    Years since quitting: 25.6  . Smokeless tobacco: Never Used  Substance Use Topics  . Alcohol use: No  . Drug use: Yes    Types: Marijuana    Comment: 03-2018 smokes daily     Allergies   Eggs or egg-derived products   Review of Systems Review of Systems  Constitutional: Negative for chills and fever.  Respiratory: Negative for shortness of breath.   Cardiovascular: Negative for chest pain.  Gastrointestinal: Positive for abdominal pain and constipation. Negative for diarrhea, nausea and vomiting.  Genitourinary: Positive for flank pain and frequency. Negative for difficulty urinating, dysuria, hematuria, penile pain and testicular pain.  Musculoskeletal: Positive for back pain and neck pain.     Physical Exam Updated Vital Signs BP 123/83 (BP Location: Right Arm)   Pulse (!) 51   Temp 98.2 F (36.8 C) (Oral)   Resp 16   SpO2 99%   Physical Exam Vitals signs and nursing note reviewed.  Constitutional:       General: He is not in acute distress.    Appearance: Normal appearance. He is well-developed. He is not ill-appearing.  HENT:     Head: Normocephalic and atraumatic.  Eyes:     General: No scleral icterus.       Right eye: No discharge.        Left eye: No discharge.     Conjunctiva/sclera: Conjunctivae normal.     Pupils: Pupils are equal, round, and reactive to light.  Neck:     Musculoskeletal: Normal range of motion.  Cardiovascular:     Rate and Rhythm: Bradycardia present.  Pulmonary:     Effort: Pulmonary effort is normal. No respiratory distress.     Breath sounds: Normal breath sounds.  Abdominal:  General: There is no distension.     Palpations: Abdomen is soft.     Tenderness: There is no abdominal tenderness. There is no right CVA tenderness or left CVA tenderness.  Musculoskeletal:     Comments: Back: Inspection: No masses, deformity, or rash Palpation: No midline spinal tenderness. No paraspinal muscle tenderness. Strength: 5/5 in lower extremities and normal plantar and dorsiflexion SLR: Negative seated straight leg raise Gait: Normal gait   Skin:    General: Skin is warm and dry.  Neurological:     Mental Status: He is alert and oriented to person, place, and time.  Psychiatric:        Behavior: Behavior normal.      ED Treatments / Results  Labs (all labs ordered are listed, but only abnormal results are displayed) Labs Reviewed  URINALYSIS, ROUTINE W REFLEX MICROSCOPIC - Abnormal; Notable for the following components:      Result Value   Hgb urine dipstick MODERATE (*)    All other components within normal limits  COMPREHENSIVE METABOLIC PANEL - Abnormal; Notable for the following components:   Calcium 8.8 (*)    Total Protein 5.7 (*)    All other components within normal limits  CBC WITH DIFFERENTIAL/PLATELET    EKG None  Radiology Ct Renal Stone Study  Result Date: 07/20/2019 CLINICAL DATA:  48 year old male with a history of right  flank pain for 3 days EXAM: CT ABDOMEN AND PELVIS WITHOUT CONTRAST TECHNIQUE: Multidetector CT imaging of the abdomen and pelvis was performed following the standard protocol without IV contrast. COMPARISON:  September 11, 2016 FINDINGS: Lower chest: No acute finding Hepatobiliary: Unremarkable liver.  Gallbladder is decompressed Pancreas: Unremarkable Spleen: Unremarkable Adrenals/Urinary Tract: Unremarkable appearance of the adrenal glands. No evidence of hydronephrosis of the right or left kidney. No nephrolithiasis. Unremarkable course of the bilateral ureters. Unremarkable appearance of the urinary bladder. Stomach/Bowel: Stomach is within normal limits. Appendix appears normal. No evidence of bowel wall thickening, distention, or inflammatory changes. Diverticular disease without evidence of inflammatory changes. Vascular/Lymphatic: Minimal atherosclerotic changes of the abdominal aorta and iliac arteries. No aneurysm. No periaortic fluid. Reproductive: Prostate diameter 4.5 cm Other: Small fat containing ventral hernia just above the umbilicus Musculoskeletal: No acute displaced fracture. Degenerative changes of the spine. IMPRESSION: CT negative for acute finding to account for right flank pain. Negative for nephrolithiasis. Ancillary findings as above. Electronically Signed   By: Gilmer MorJaime  Wagner D.O.   On: 07/20/2019 13:44    Procedures Procedures (including critical care time)  Medications Ordered in ED Medications - No data to display   Initial Impression / Assessment and Plan / ED Course  I have reviewed the triage vital signs and the nursing notes.  Pertinent labs & imaging results that were available during my care of the patient were reviewed by me and considered in my medical decision making (see chart for details).  48 year old male presents with low back pain radiating to the abdomen for the past 3 to 4 days.  He denies history of back pain or history of kidney stones.  He is never had  pain like this before.  On exam he is mildly bradycardic but otherwise vital signs are normal.  He states he is not currently having pain right now and his back and abdomen are nontender.  He is ambulatory without difficulty.  He mentioned to the triage nurse that his neck was hurting as well.  This seems to be a chronic issue for him.  Will  obtain CBC, CMP, UA, CT renal  CT renal is negative for nephrolithiasis but does show some degenerative changes of the spine.  This likely the etiology of his pain.  No red flags to suggest acute spinal infection, cauda equina. Labs are overall normal.  The UA has hemoglobin but otherwise is normal.  We will give him Mobic and Flexeril and have him follow-up with his doctor.  Final Clinical Impressions(s) / ED Diagnoses   Final diagnoses:  Right flank pain  Acute right-sided low back pain without sciatica    ED Discharge Orders    None       Bethel BornGekas, Vylette Strubel Marie, PA-C 07/20/19 1440    Tegeler, Canary Brimhristopher J, MD 07/20/19 [phone]

## 2019-07-20 NOTE — ED Notes (Signed)
Pt. Unable to urinate at this time. Given water.

## 2019-07-20 NOTE — ED Triage Notes (Signed)
Pt reports for the last 3 days he has had right flank and left sided neck pain as well. Pt denies any abd pain or urinary symptoms.

## 2019-07-20 NOTE — Discharge Instructions (Signed)
Take Mobic for pain - do not take Ibuprofen or Aleve with this medicine Take Flexeril as needed for muscle pain/spasms - this medicine can make you sleepy so it's best to take before bedtime to help you sleep Please follow up with your doctor

## 2019-11-23 ENCOUNTER — Encounter (HOSPITAL_COMMUNITY): Payer: Self-pay | Admitting: Emergency Medicine

## 2019-11-23 ENCOUNTER — Emergency Department (HOSPITAL_COMMUNITY)
Admission: EM | Admit: 2019-11-23 | Discharge: 2019-11-24 | Disposition: A | Discharge status: Home / Self Care | Payer: Medicaid Other | Attending: Emergency Medicine | Admitting: Emergency Medicine

## 2019-11-23 ENCOUNTER — Other Ambulatory Visit: Payer: Self-pay

## 2019-11-23 DIAGNOSIS — M546 Pain in thoracic spine: Secondary | ICD-10-CM | POA: Diagnosis present

## 2019-11-23 DIAGNOSIS — Z5321 Procedure and treatment not carried out due to patient leaving prior to being seen by health care provider: Secondary | ICD-10-CM | POA: Diagnosis not present
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
## 2019-11-24 ENCOUNTER — Other Ambulatory Visit: Payer: Self-pay

## 2019-11-24 ENCOUNTER — Ambulatory Visit (HOSPITAL_COMMUNITY)
Admission: EM | Admit: 2019-11-24 | Discharge: 2019-11-24 | Disposition: A | Discharge status: Home / Self Care | Payer: Medicaid Other | Attending: Family Medicine | Admitting: Family Medicine

## 2019-11-24 ENCOUNTER — Encounter (HOSPITAL_COMMUNITY): Payer: Self-pay

## 2019-11-24 DIAGNOSIS — Z79899 Other long term (current) drug therapy: Secondary | ICD-10-CM | POA: Insufficient documentation

## 2019-11-24 DIAGNOSIS — F319 Bipolar disorder, unspecified: Secondary | ICD-10-CM | POA: Diagnosis not present

## 2019-11-24 DIAGNOSIS — M7918 Myalgia, other site: Secondary | ICD-10-CM | POA: Insufficient documentation

## 2019-11-24 DIAGNOSIS — M25512 Pain in left shoulder: Secondary | ICD-10-CM | POA: Insufficient documentation

## 2019-11-24 DIAGNOSIS — Z87891 Personal history of nicotine dependence: Secondary | ICD-10-CM | POA: Insufficient documentation

## 2019-11-24 DIAGNOSIS — Z20828 Contact with and (suspected) exposure to other viral communicable diseases: Secondary | ICD-10-CM | POA: Insufficient documentation

## 2019-11-24 DIAGNOSIS — M791 Myalgia, unspecified site: Secondary | ICD-10-CM

## 2019-11-24 DIAGNOSIS — M546 Pain in thoracic spine: Secondary | ICD-10-CM | POA: Insufficient documentation

## 2019-11-24 MED ORDER — KETOROLAC TROMETHAMINE 30 MG/ML IJ SOLN
INTRAMUSCULAR | Status: AC
  Filled 2019-11-24: qty 1

## 2019-11-24 MED ORDER — KETOROLAC TROMETHAMINE 30 MG/ML IJ SOLN
30.0000 mg | Freq: Once | INTRAMUSCULAR | Status: AC
Start: 2019-11-24 — End: 2019-11-24
  Administered 2019-11-24: 30 mg via INTRAMUSCULAR
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
## 2019-11-24 NOTE — ED Notes (Signed)
Called pt x3 for vitals. No answer.  

## 2019-11-26 LAB — NOVEL CORONAVIRUS, NAA (HOSP ORDER, SEND-OUT TO REF LAB; TAT 18-24 HRS): SARS-CoV-2, NAA: NOT DETECTED

## 2020-03-16 ENCOUNTER — Encounter: Payer: Self-pay | Admitting: Gastroenterology

## 2020-04-20 ENCOUNTER — Ambulatory Visit: Payer: Medicaid Other | Admitting: Gastroenterology

## 2020-08-28 ENCOUNTER — Encounter (HOSPITAL_COMMUNITY): Payer: Self-pay

## 2020-08-28 ENCOUNTER — Ambulatory Visit: Payer: Self-pay

## 2020-08-28 ENCOUNTER — Other Ambulatory Visit: Payer: Self-pay

## 2020-08-28 ENCOUNTER — Ambulatory Visit (INDEPENDENT_AMBULATORY_CARE_PROVIDER_SITE_OTHER): Payer: Medicaid Other

## 2020-08-28 ENCOUNTER — Ambulatory Visit (HOSPITAL_COMMUNITY)
Admission: EM | Admit: 2020-08-28 | Discharge: 2020-08-28 | Disposition: A | Discharge status: Home / Self Care | Payer: Medicaid Other | Attending: Emergency Medicine | Admitting: Emergency Medicine

## 2020-08-28 DIAGNOSIS — S2232XA Fracture of one rib, left side, initial encounter for closed fracture: Secondary | ICD-10-CM

## 2020-08-28 DIAGNOSIS — R0781 Pleurodynia: Secondary | ICD-10-CM

## 2020-08-28 MED ORDER — ALBUTEROL SULFATE HFA 108 (90 BASE) MCG/ACT IN AERS
INHALATION_SPRAY | RESPIRATORY_TRACT | Status: AC
  Filled 2020-08-28: qty 6.7

## 2020-08-28 MED ORDER — HYDROCODONE-ACETAMINOPHEN 5-325 MG PO TABS: 1.0000 | ORAL_TABLET | Freq: Four times a day (QID) | ORAL | 0 refills | Status: DC | PRN

## 2020-08-28 MED ORDER — NAPROXEN 500 MG PO TABS: 500.0000 mg | ORAL_TABLET | Freq: Two times a day (BID) | ORAL | 0 refills | Status: DC

## 2020-08-28 MED ORDER — ALBUTEROL SULFATE HFA 108 (90 BASE) MCG/ACT IN AERS
2.0000 | INHALATION_SPRAY | Freq: Once | RESPIRATORY_TRACT | Status: AC
  Administered 2020-08-28: 2 via RESPIRATORY_TRACT
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
## 2020-08-28 NOTE — Discharge Instructions (Signed)
Deep breathing and cough regularly to help open your lungs, to prevent infection of your lungs.  Decrease smoking.  Use of the incentive spirometer provided as reminded an aid to deep breathe.  Use of pillow to brace your chest wall with coughing or deep breathing to help with pain.  Naproxen twice a day. Take with food.  Hydrocodone for breakthrough pain. May cause drowsiness. Please do not take if driving or drinking alcohol.  May cause constipation.  Please follow up with your PCP next week for recheck.  Return sooner for shortness of breath , fever or difficulty breathing

## 2020-08-28 NOTE — ED Provider Notes (Signed)
[facility]    CSN: [id number] Arrival date & time: 08/28/20  6387      History   Chief Complaint Chief Complaint  Patient presents with  . Left Rib Injury    HPI Clinton Allen is a 49 y.o. male.   Clinton Allen presents with complaints of left sided rib pain. This morning he tripped on a child's toy, causing him to land on the footboard of his bed, onto his left chest wall. Pain since. Pain with coughing. No history of asthma. Smokes marijuana daily.    ROS per HPI, negative if not otherwise mentioned.      Past Medical History:  Diagnosis Date  . Allergic rhinitis   . Bipolar 1 disorder (HCC)   . Bronchitis   . Complication of anesthesia    allergy to eggs  . Degenerative lumbar disc   . Erectile dysfunction   . Hammer toe   . Marijuana abuse    03-2018 smokes daily  . Neck fracture (HCC)   . Vitamin D deficiency     Patient Active Problem List   Diagnosis Date Noted  . Encounter for screening colonoscopy 07/29/2016  . Constipation 07/29/2016  . Abdominal bloating 07/29/2016  . Elevated LFTs 07/29/2016  . Elevated lipase 07/29/2016  . Status post right foot surgery 07/31/2015  . Status post left foot surgery 05/02/2015  . Porokeratosis 04/17/2015  . 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 Pain in lower limb 04/17/2015  . Foot pain 01/02/2014  . Chronic pain associated with significant psychosocial dysfunction 01/02/2014  . Bipolar I disorder (HCC) 06/30/2013  . History of surgical procedure 06/30/2013  . Cannabis abuse 06/30/2013  . 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 Cotton Osteotomy w/ Bone Graft Left 04/27/2015  . Cotton Osteotomy w/ Bone Graft Right 06/29/2015  . CYST EXCISION Right 04/08/2018   Procedure: EXCISITON CYST FROM RIGHT BUTTOCKS ERAS PATHWAY;  Surgeon: Luretha Murphy, MD;  Location: [facility];  Service: General;  Laterality: Right;  . cyst removed from buttocks      04-08-18 Dr. Daphine Deutscher  . Hammer Toe Repair Left 04/27/2015   Lt #2  . HERNIA REPAIR     umbilical  . NECK SURGERY    . TOE SURGERY Bilateral        Home Medications    Prior to Admission medications   Medication Sig Start Date End Date Taking? Authorizing Provider  albuterol (PROVENTIL HFA;VENTOLIN HFA) 108 (90 Base) MCG/ACT inhaler Inhale 2 puffs into the lungs every 6 (six) hours as needed for wheezing or shortness of breath. 06/28/16   Derwood Kaplan, MD  HYDROcodone-acetaminophen (NORCO/VICODIN) 5-325 MG tablet Take 1 tablet by mouth every 6 (six) hours as needed. 08/28/20   Georgetta Haber, NP  loratadine (CLARITIN) 10 MG tablet Take 10 mg by mouth daily as needed for allergies.    [provider]  naproxen (NAPROSYN) 500 MG tablet Take 1 tablet (500 mg total) by mouth 2 (two) times daily. 08/28/20   Georgetta Haber, NP    Family History Family History  Problem Relation Age of Onset  . Heart attack Father   . Hypertension Mother   . Hyperlipidemia Mother   . Hyperkalemia Mother   . Lung cancer Maternal Uncle        smoker    Social History Social History   Tobacco Use  . Smoking status: Former Smoker    Quit date:  12/16/1993    Years since quitting: 26.7  . Smokeless tobacco: Never Used  Vaping Use  . Vaping Use: Never used  Substance Use Topics  . Alcohol use: No  . Drug use: Yes    Types: Marijuana    Comment: 03-2018 smokes daily     Allergies   Eggs or egg-derived products   Review of Systems Review of Systems   Physical Exam Triage Vital Signs ED Triage Vitals  Enc Vitals Group     BP 08/28/20 1134 (!) 135/95     Pulse Rate 08/28/20 1134 73     Resp 08/28/20 1134 16     Temp 08/28/20 1134 98.5 F (36.9 C)     Temp Source 08/28/20 1134 Oral     SpO2 08/28/20 1134 99 %     Weight --      Height --      Head Circumference --      Peak Flow --      Pain Score 08/28/20 1132 8     Pain Loc --      Pain Edu? --      Excl. in GC? --     No data found.  Updated Vital Signs BP (!) 135/95 (BP Location: Right Arm)   Pulse 73   Temp 98.5 F (36.9 C) (Oral)   Resp 16   SpO2 99%   Visual Acuity Right Eye Distance:   Left Eye Distance:   Bilateral Distance:    Right Eye Near:   Left Eye Near:    Bilateral Near:     Physical Exam Constitutional:      Appearance: He is well-developed.  Cardiovascular:     Rate and Rhythm: Normal rate.  Pulmonary:     Effort: Pulmonary effort is normal.     Breath sounds: Wheezing present.  Chest:     Chest wall: Tenderness present. No deformity or crepitus.    Skin:    General: Skin is warm and dry.  Neurological:     Mental Status: He is alert and oriented to person, place, and time.      UC Treatments / Results  Labs (all labs ordered are listed, but only abnormal results are displayed) Labs Reviewed - No data to display  EKG   Radiology DG Ribs Unilateral W/Chest Left  Result Date: 08/28/2020 CLINICAL DATA:  Pain following fall EXAM: LEFT RIBS AND CHEST - 3+ VIEW COMPARISON:  Chest radiograph July 03, 2016. FINDINGS: Frontal chest as well as oblique and cone-down lateral images were obtained. There is slight bibasilar atelectasis. There is no edema or airspace opacity. Heart size and pulmonary vascularity are normal. No adenopathy. There is no appreciable pneumothorax or pleural effusion. There is a nondisplaced fracture in the anterior left eighth rib. No other fracture evident. IMPRESSION: Nondisplaced fracture anterior left eighth rib. No pneumothorax. Minimal bibasilar atelectasis. Lungs elsewhere clear. These results will be called to the ordering clinician or representative by the Radiologist Assistant, and communication documented in the PACS or [employer]. Electronically Signed   By: Bretta Bang III M.D.   On: 08/28/2020 11:51    Procedures Procedures (including critical care time)  Medications Ordered in UC Medications  albuterol (VENTOLIN  HFA) 108 (90 Base) MCG/ACT inhaler 2 puff (has no administration in time range)    Initial Impression / Assessment and Plan / UC Course  I have reviewed the triage vital signs and the nursing notes.  Pertinent labs & imaging results that  were available during my care of the patient were reviewed by me and considered in my medical decision making (see chart for details).     Left 8th rib fracture on xray s/p fall injury today. No shortness of breath . No hypoxia. Wheezes throughout, smokes marijuana daily. Inhaler provided. Pain management cough and deep breathing emphasized and discussed. Encouraged follow up with PCP for recheck. Return precautions provided. Patient verbalized understanding and agreeable to plan.   Final Clinical Impressions(s) / UC Diagnoses   Final diagnoses:  Closed fracture of one rib of left side, initial encounter     Discharge Instructions     Deep breathing and cough regularly to help open your lungs, to prevent infection of your lungs.  Decrease smoking.  Use of the incentive spirometer provided as reminded an aid to deep breathe.  Use of pillow to brace your chest wall with coughing or deep breathing to help with pain.  Naproxen twice a day. Take with food.  Hydrocodone for breakthrough pain. May cause drowsiness. Please do not take if driving or drinking alcohol.  May cause constipation.  Please follow up with your PCP next week for recheck.  Return sooner for shortness of breath , fever or difficulty breathing   ED Prescriptions    Medication Sig Dispense Auth. Provider   naproxen (NAPROSYN) 500 MG tablet Take 1 tablet (500 mg total) by mouth 2 (two) times daily. 30 tablet Linus Mako B, NP   HYDROcodone-acetaminophen (NORCO/VICODIN) 5-325 MG tablet Take 1 tablet by mouth every 6 (six) hours as needed. 10 tablet Georgetta Haber, NP     I have reviewed the PDMP during this encounter.   Georgetta Haber, NP 08/28/20 1234

## 2020-08-29 ENCOUNTER — Ambulatory Visit (HOSPITAL_COMMUNITY): Payer: Self-pay

## 2020-12-23 ENCOUNTER — Ambulatory Visit: Payer: Medicaid Other | Attending: Internal Medicine

## 2020-12-23 DIAGNOSIS — Z23 Encounter for immunization: Secondary | ICD-10-CM

## 2021-04-03 ENCOUNTER — Ambulatory Visit
Admission: EM | Admit: 2021-04-03 | Discharge: 2021-04-03 | Disposition: A | Discharge status: Home / Self Care | Payer: Medicaid Other | Attending: Family Medicine | Admitting: Family Medicine

## 2021-04-03 ENCOUNTER — Other Ambulatory Visit: Payer: Self-pay

## 2021-04-03 DIAGNOSIS — R519 Headache, unspecified: Secondary | ICD-10-CM

## 2021-04-03 DIAGNOSIS — J069 Acute upper respiratory infection, unspecified: Secondary | ICD-10-CM

## 2021-04-03 MED ORDER — FLUTICASONE PROPIONATE 50 MCG/ACT NA SUSP: 2.0000 | Freq: Every day | NASAL | 0 refills | Status: DC

## 2021-04-03 MED ORDER — PREDNISONE 20 MG PO TABS: 20.0000 mg | ORAL_TABLET | Freq: Every day | ORAL | 0 refills | Status: AC

## 2021-04-03 NOTE — ED Provider Notes (Signed)
[facility]    CSN: [id number] Arrival date & time: 04/03/21  8563      History   Chief Complaint Chief Complaint  Patient presents with  . Cough    HPI Clinton Allen is a 50 y.o. male.   HPI  Patient presents with URI symptoms including headache, cough, chills, symptoms started about several days ago and resolved to subsequently return over the last 2 days. Patient is a daily smoker. Denies SOB. Everyone at home is sick with similar symptoms. He has not attempted to treat symptoms with any OTC medication. Chills and HA is most worrisome symptom with intermittent nasal drainage. Past Medical History:  Diagnosis Date  . Allergic rhinitis   . Bipolar 1 disorder (HCC)   . Bronchitis   . Complication of anesthesia    allergy to eggs  . Degenerative lumbar disc   . Erectile dysfunction   . Hammer toe   . Marijuana abuse    03-2018 smokes daily  . Neck fracture (HCC)   . Vitamin D deficiency     Patient Active Problem List   Diagnosis Date Noted  . Encounter for screening colonoscopy 07/29/2016  . Constipation 07/29/2016  . Abdominal bloating 07/29/2016  . Elevated LFTs 07/29/2016  . Elevated lipase 07/29/2016  . Status post right foot surgery 07/31/2015  . Status post left foot surgery 05/02/2015  . Porokeratosis 04/17/2015  . 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 Pain in lower limb 04/17/2015  . Foot pain 01/02/2014  . Chronic pain associated with significant psychosocial dysfunction 01/02/2014  . Bipolar I disorder (HCC) 06/30/2013  . History of surgical procedure 06/30/2013  . Cannabis abuse 06/30/2013  . 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 Cotton Osteotomy w/ Bone Graft Left 04/27/2015  . Cotton Osteotomy w/ Bone Graft Right 06/29/2015  . CYST EXCISION Right 04/08/2018   Procedure: EXCISITON CYST FROM RIGHT BUTTOCKS ERAS PATHWAY;  Surgeon: Luretha Murphy, MD;  Location: [facility];   Service: General;  Laterality: Right;  . cyst removed from buttocks     04-08-18 Dr. Daphine Deutscher  . Hammer Toe Repair Left 04/27/2015   Lt #2  . HERNIA REPAIR     umbilical  . NECK SURGERY    . TOE SURGERY Bilateral        Home Medications    Prior to Admission medications   Medication Sig Start Date End Date Taking? Authorizing Provider  fluticasone (FLONASE) 50 MCG/ACT nasal spray Place 2 sprays into both nostrils daily. 04/03/21  Yes Bing Neighbors, FNP  predniSONE (DELTASONE) 20 MG tablet Take 1 tablet (20 mg total) by mouth daily with breakfast for 5 days. 04/03/21 04/08/21 Yes Bing Neighbors, FNP  loratadine (CLARITIN) 10 MG tablet Take 10 mg by mouth daily as needed for allergies.    [provider]    Family History Family History  Problem Relation Age of Onset  . Heart attack Father   . Hypertension Mother   . Hyperlipidemia Mother   . Hyperkalemia Mother   . Lung cancer Maternal Uncle        smoker    Social History Social History   Tobacco Use  . Smoking status: Former Smoker    Quit date: 12/16/1993    Years since quitting: 27.3  . Smokeless tobacco: Never Used  Vaping Use  . Vaping Use: Never used  Substance Use Topics  . Alcohol use: No  . Drug  use: Not Currently    Types: Marijuana    Comment: 03-2018 smokes daily     Allergies   Eggs or egg-derived products   Review of Systems Review of Systems Pertinent negatives listed in HPI  Physical Exam Triage Vital Signs ED Triage Vitals  Enc Vitals Group     BP 04/03/21 0946 119/78     Pulse Rate 04/03/21 0946 (!) 52     Resp 04/03/21 0946 18     Temp 04/03/21 0946 98.9 F (37.2 C)     Temp Source 04/03/21 0946 Oral     SpO2 04/03/21 0946 98 %     Weight --      Height --      Head Circumference --      Peak Flow --      Pain Score 04/03/21 0947 0     Pain Loc --      Pain Edu? --      Excl. in GC? --    No data found.  Updated Vital Signs BP 119/78 (BP Location: Left Arm)    Pulse (!) 52   Temp 98.9 F (37.2 C) (Oral)   Resp 18   SpO2 98%   Visual Acuity Right Eye Distance:   Left Eye Distance:   Bilateral Distance:    Right Eye Near:   Left Eye Near:    Bilateral Near:     Physical Exam  General Appearance:    Alert, cooperative, no distress  HENT:   Normocephalic, ears normal, nares mucosal edema with congestion present, oropharynx clear    Eyes:    PERRL, conjunctiva/corneas clear, EOM's intact       Lungs:     Clear to auscultation bilaterally, respirations unlabored  Heart:    Regular rate and rhythm  Neurologic:   Awake, alert, oriented x 3. No apparent focal neurological           defect.     UC Treatments / Results  Labs (all labs ordered are listed, but only abnormal results are displayed) Labs Reviewed - No data to display  EKG   Radiology No results found.  Procedures Procedures (including critical care time)  Medications Ordered in UC Medications - No data to display  Initial Impression / Assessment and Plan / UC Course  I have reviewed the triage vital signs and the nursing notes.  Pertinent labs & imaging results that were available during my care of the patient were reviewed by me and considered in my medical decision making (see chart for details).    Acute URI with sinus headache.  Symptoms appear to be viral in etiology.  Treatment per discharge instructions.  Follow-up with PCP if symptoms worsen or do not improve.  Final Clinical Impressions(s) / UC Diagnoses   Final diagnoses:  Acute URI  Sinus headache   Discharge Instructions   None    ED Prescriptions    Medication Sig Dispense Auth. Provider   predniSONE (DELTASONE) 20 MG tablet Take 1 tablet (20 mg total) by mouth daily with breakfast for 5 days. 5 tablet Bing Neighbors, FNP   fluticasone (FLONASE) 50 MCG/ACT nasal spray Place 2 sprays into both nostrils daily. 16 g Bing Neighbors, FNP     PDMP not reviewed this encounter.   Bing Neighbors, FNP 04/03/21 1029

## 2021-04-03 NOTE — ED Triage Notes (Signed)
Pt c/o chills, cough, and nasal congestion since yesterday.

## 2021-06-11 ENCOUNTER — Ambulatory Visit
Admission: EM | Admit: 2021-06-11 | Discharge: 2021-06-11 | Disposition: A | Discharge status: Home / Self Care | Payer: Medicaid Other | Attending: Internal Medicine | Admitting: Internal Medicine

## 2021-06-11 DIAGNOSIS — B86 Scabies: Secondary | ICD-10-CM | POA: Diagnosis not present

## 2021-06-11 MED ORDER — PERMETHRIN 5 % EX CREA: 1.0000 "application " | TOPICAL_CREAM | Freq: Once | CUTANEOUS | 1 refills | Status: AC

## 2021-06-11 NOTE — ED Triage Notes (Signed)
Pt c/o rash to face/head/ neck x2 days. Denies new or change in products.

## 2021-06-11 NOTE — ED Provider Notes (Signed)
[facility]    CSN: [id number] Arrival date & time: 06/11/21  1159      History   Chief Complaint Chief Complaint  Patient presents with   Rash    HPI Clinton Allen is a 50 y.o. male presents to urgent care with son today with complaints of rash.  Patient states toddler son has had rash for several days and he began 2 days ago with pruritic rash to face and neck.  Patient denies any new soaps, lotions or detergents.  He denies any recent fever, chills or new medications.   Patient Active Problem List   Diagnosis Date Noted   Encounter for screening colonoscopy 07/29/2016   Constipation 07/29/2016   Abdominal bloating 07/29/2016   Elevated LFTs 07/29/2016   Elevated lipase 07/29/2016   Status post right foot surgery 07/31/2015   Status post left foot surgery 05/02/2015   Porokeratosis 04/17/2015   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   Pain in lower limb 04/17/2015   Foot pain 01/02/2014   Chronic pain associated with significant psychosocial dysfunction 01/02/2014   Bipolar I disorder (HCC) 06/30/2013   History of surgical procedure 06/30/2013   Cannabis abuse 06/30/2013   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 [place] Ravens Toe Repair Left 04/27/2015   Lt #2   HERNIA REPAIR     umbilical   NECK SURGERY     TOE SURGERY Bilateral        Home Medications    Prior to Admission medications   Medication Sig Start Date End Date Taking? Authorizing Provider  permethrin (ELIMITE) 5 % cream Apply 1 application topically once for 1 dose. Apply head to toe.  Leave on for 8 to 14  hours and wash off with water.  Repeat in 1 week 06/11/21 06/11/21 Yes Rolla Etienne, NP    Family History Family History  Problem Relation Age of Onset   Heart attack Father    Hypertension Mother    Hyperlipidemia Mother    Hyperkalemia Mother    Lung cancer Maternal Uncle        smoker    Social History Social History   Tobacco Use   Smoking status: Former    Pack years: 0.00    Types: Cigarettes    Quit date: 12/16/1993    Years since quitting: 27.5   Smokeless tobacco: Never  Vaping Use   Vaping Use: Never used  Substance Use Topics   Alcohol use: No   Drug use: Not Currently    Types: Marijuana    Comment: 03-2018 smokes daily     Allergies   Eggs or egg-derived products   Review of Systems As stated in HPI otherwise negative   Physical Exam Triage Vital Signs ED Triage Vitals  Enc Vitals Group     BP 06/11/21 1323 129/77     Pulse Rate 06/11/21 1323 (!) 45     Resp 06/11/21 1323 18     Temp 06/11/21 1323 98.3 F (36.8 C)     Temp Source 06/11/21 1323 Oral     SpO2  06/11/21 1323 96 %     Weight --      Height --      Head Circumference --      Peak Flow --      Pain Score 06/11/21 1324 0     Pain Loc --      Pain Edu? --      Excl. in GC? --    No data found.  Updated Vital Signs BP 129/77 (BP Location: Left Arm)   Pulse (!) 45   Temp 98.3 F (36.8 C) (Oral)   Resp 18   SpO2 96%   Visual Acuity Right Eye Distance:   Left Eye Distance:   Bilateral Distance:    Right Eye Near:   Left Eye Near:    Bilateral Near:     Physical Exam Constitutional:      General: He is not in acute distress.    Appearance: Normal appearance. He is normal weight. He is not ill-appearing or toxic-appearing.  Skin:    General: Skin is warm and dry.     Comments: Small scattered erythematous papules on forehead and surrounding neck.  No crusting or drainage  Neurological:     General: No focal deficit present.     Mental Status: He is alert and  oriented to person, place, and time.  Psychiatric:        Mood and Affect: Mood normal.        Behavior: Behavior normal.     UC Treatments / Results  Labs (all labs ordered are listed, but only abnormal results are displayed) Labs Reviewed - No data to display  EKG   Radiology No results found.  Procedures Procedures (including critical care time)  Medications Ordered in UC Medications - No data to display  Initial Impression / Assessment and Plan / UC Course  I have reviewed the triage vital signs and the nursing notes.  Pertinent labs & imaging results that were available during my care of the patient were reviewed by me and considered in my medical decision making (see chart for details).  Rash -Exam consistent with scabies infestation.  Toddler signed out here with similar rash -Permethrin prescribed -Advised on household cleaning and washing sheets/towels in hot water -Follow-up for worsening or persistent symptoms  Reviewed expections re: course of current medical issues. Questions answered. Outlined signs and symptoms indicating need for more acute intervention. Pt verbalized understanding. AVS given  Final Clinical Impressions(s) / UC Diagnoses   Final diagnoses:  Scabies     Discharge Instructions      Use cream as prescribed.  Repeat in 1 week.  As mentioned in other discharge paperwork, wash bedding and towels in hot water and deep cleaning house.     ED Prescriptions     Medication Sig Dispense Auth. Provider   permethrin (ELIMITE) 5 % cream Apply 1 application topically once for 1 dose. Apply head to toe.  Leave on for 8 to 14 hours and wash off with water.  Repeat in 1 week 60 g Rolla Etienne, NP      PDMP not reviewed this encounter.   Rolla Etienne, NP 06/11/21 1353

## 2021-06-11 NOTE — Discharge Instructions (Addendum)
Use cream as prescribed.  Repeat in 1 week.  As mentioned in other discharge paperwork, wash bedding and towels in hot water and deep cleaning house.

## 2021-12-09 ENCOUNTER — Emergency Department (HOSPITAL_COMMUNITY)
Admission: EM | Admit: 2021-12-09 | Discharge: 2021-12-09 | Discharge status: Against Medical Advice | Payer: Medicaid Other

## 2021-12-09 ENCOUNTER — Other Ambulatory Visit: Payer: Self-pay

## 2021-12-09 NOTE — ED Notes (Signed)
Pt is with kids in peds

## 2021-12-09 NOTE — ED Notes (Signed)
Pt wife states that he left

## 2021-12-27 ENCOUNTER — Other Ambulatory Visit: Payer: Self-pay | Admitting: Internal Medicine

## 2021-12-28 LAB — COMPLETE METABOLIC PANEL WITH GFR
AG Ratio: 1.8 (calc) (ref 1.0–2.5)
ALT: 14 U/L (ref 9–46)
AST: 19 U/L (ref 10–35)
Albumin: 4.4 g/dL (ref 3.6–5.1)
Alkaline phosphatase (APISO): 81 U/L (ref 35–144)
BUN: 12 mg/dL (ref 7–25)
CO2: 25 mmol/L (ref 20–32)
Calcium: 9.5 mg/dL (ref 8.6–10.3)
Chloride: 104 mmol/L (ref 98–110)
Creat: 1.14 mg/dL (ref 0.70–1.30)
Globulin: 2.5 g/dL (calc) (ref 1.9–3.7)
Glucose, Bld: 71 mg/dL (ref 65–139)
Potassium: 4.6 mmol/L (ref 3.5–5.3)
Sodium: 140 mmol/L (ref 135–146)
Total Bilirubin: 0.6 mg/dL (ref 0.2–1.2)
Total Protein: 6.9 g/dL (ref 6.1–8.1)
eGFR: 78 mL/min/{1.73_m2} (ref >=60)

## 2021-12-28 LAB — LIPID PANEL
Cholesterol: 169 mg/dL (ref <200)
HDL: 59 mg/dL (ref >=40)
LDL Cholesterol (Calc): 98 mg/dL (calc)
Non-HDL Cholesterol (Calc): 110 mg/dL (calc) (ref <130)
Total CHOL/HDL Ratio: 2.9 (calc) (ref <5.0)
Triglycerides: 40 mg/dL (ref <150)

## 2021-12-28 LAB — CBC
HCT: 41.3 % (ref 38.5–50.0)
Hemoglobin: 14.1 g/dL (ref 13.2–17.1)
MCH: 32.7 pg (ref 27.0–33.0)
MCHC: 34.1 g/dL (ref 32.0–36.0)
MCV: 95.8 fL (ref 80.0–100.0)
MPV: 11.6 fL (ref 7.5–12.5)
Platelets: 165 10*3/uL (ref 140–400)
RBC: 4.31 10*6/uL (ref 4.20–5.80)
RDW: 12.1 % (ref 11.0–15.0)
WBC: 3.4 10*3/uL — ABNORMAL LOW (ref 3.8–10.8)

## 2021-12-28 LAB — HIV ANTIBODY (ROUTINE TESTING W REFLEX): HIV 1&2 Ab, 4th Generation: NONREACTIVE

## 2021-12-28 LAB — RPR (MONITOR) W/REFL: RPR (Monitor) w/refl Titer: NONREACTIVE

## 2021-12-28 LAB — PSA: PSA: 0.83 ng/mL (ref <=4.00)

## 2021-12-28 LAB — VITAMIN D 25 HYDROXY (VIT D DEFICIENCY, FRACTURES): Vit D, 25-Hydroxy: 18 ng/mL — ABNORMAL LOW (ref 30–100)

## 2022-01-01 ENCOUNTER — Other Ambulatory Visit: Payer: Self-pay

## 2022-01-01 ENCOUNTER — Ambulatory Visit
Admission: EM | Admit: 2022-01-01 | Discharge: 2022-01-01 | Disposition: A | Discharge status: Home / Self Care | Payer: Medicaid Other

## 2022-01-01 ENCOUNTER — Encounter (HOSPITAL_BASED_OUTPATIENT_CLINIC_OR_DEPARTMENT_OTHER): Payer: Self-pay

## 2022-01-01 ENCOUNTER — Encounter: Payer: Self-pay | Admitting: Emergency Medicine

## 2022-01-01 ENCOUNTER — Emergency Department (HOSPITAL_BASED_OUTPATIENT_CLINIC_OR_DEPARTMENT_OTHER): Payer: Medicaid Other | Admitting: Radiology

## 2022-01-01 ENCOUNTER — Emergency Department (HOSPITAL_BASED_OUTPATIENT_CLINIC_OR_DEPARTMENT_OTHER)
Admission: EM | Admit: 2022-01-01 | Discharge: 2022-01-01 | Disposition: A | Discharge status: Home / Self Care | Payer: Medicaid Other | Attending: Emergency Medicine | Admitting: Emergency Medicine

## 2022-01-01 DIAGNOSIS — R001 Bradycardia, unspecified: Secondary | ICD-10-CM | POA: Insufficient documentation

## 2022-01-01 DIAGNOSIS — R079 Chest pain, unspecified: Secondary | ICD-10-CM

## 2022-01-01 DIAGNOSIS — Z20822 Contact with and (suspected) exposure to covid-19: Secondary | ICD-10-CM | POA: Insufficient documentation

## 2022-01-01 DIAGNOSIS — R0789 Other chest pain: Secondary | ICD-10-CM | POA: Diagnosis not present

## 2022-01-01 LAB — CBC
HCT: 40.9 % (ref 39.0–52.0)
Hemoglobin: 13.5 g/dL (ref 13.0–17.0)
MCH: 31.5 pg (ref 26.0–34.0)
MCHC: 33 g/dL (ref 30.0–36.0)
MCV: 95.3 fL (ref 80.0–100.0)
Platelets: 179 10*3/uL (ref 150–400)
RBC: 4.29 MIL/uL (ref 4.22–5.81)
RDW: 12.2 % (ref 11.5–15.5)
WBC: 4 10*3/uL (ref 4.0–10.5)
nRBC: 0 % (ref 0.0–0.2)

## 2022-01-01 LAB — BASIC METABOLIC PANEL
Anion gap: 9 (ref 5–15)
BUN: 18 mg/dL (ref 6–20)
CO2: 28 mmol/L (ref 22–32)
Calcium: 9.1 mg/dL (ref 8.9–10.3)
Chloride: 103 mmol/L (ref 98–111)
Creatinine, Ser: 1.29 mg/dL — ABNORMAL HIGH (ref 0.61–1.24)
GFR, Estimated: >60 mL/min (ref >60)
Glucose, Bld: 89 mg/dL (ref 70–99)
Potassium: 4 mmol/L (ref 3.5–5.1)
Sodium: 140 mmol/L (ref 135–145)

## 2022-01-01 LAB — TROPONIN I (HIGH SENSITIVITY): Troponin I (High Sensitivity): 6 ng/L (ref <18)

## 2022-01-01 LAB — RESP PANEL BY RT-PCR (FLU A&B, COVID) ARPGX2
Influenza A by PCR: NEGATIVE
Influenza B by PCR: NEGATIVE
SARS Coronavirus 2 by RT PCR: NEGATIVE

## 2022-01-01 NOTE — ED Provider Notes (Signed)
[facility]    CSN: [id number] Arrival date & time: 01/01/22  1633      History   Chief Complaint Chief Complaint  Patient presents with   Chest Pain    HPI Clinton Allen is a 51 y.o. male.   Patient here today for evaluation of chest discomfort and overall not feeling well for the last 5 days. He does not report any fever. He reports some shortness of breath but has been able to continue going to the gym, walking for exercise.   The history is provided by the patient.  Chest Pain Associated symptoms: fatigue and shortness of breath   Associated symptoms: no fever and no numbness    Past Medical History:  Diagnosis Date   Allergic rhinitis    Bipolar 1 disorder (HCC)    Bronchitis    Complication of anesthesia    allergy to eggs   Degenerative lumbar disc    Erectile dysfunction    Hammer toe    Marijuana abuse    03-2018 smokes daily   Neck fracture ([place])    Vitamin D deficiency     Patient Active Problem List   Diagnosis Date Noted   Encounter for screening colonoscopy 07/29/2016   Constipation 07/29/2016   Abdominal bloating 07/29/2016   Elevated LFTs 07/29/2016   Elevated lipase 07/29/2016   Status post right foot surgery 07/31/2015   Status post left foot surgery 05/02/2015   Porokeratosis 04/17/2015   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   Pain in lower limb 04/17/2015   Foot pain 01/02/2014   Chronic pain associated with significant psychosocial dysfunction 01/02/2014   Bipolar I disorder ([place]) 06/30/2013   History of surgical procedure 06/30/2013   Cannabis abuse 06/30/2013   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 Donell Sievert Toe Repair Left 04/27/2015   Lt #2   HERNIA REPAIR     umbilical   NECK SURGERY     TOE SURGERY Bilateral        Home Medications    Prior to Admission medications   Medication Sig Start Date End Date Taking? Authorizing Provider  doxycycline (VIBRA-TABS) 100 MG tablet Take 100 mg by mouth 2 (two) times daily. 12/27/21  Yes [provider]    Family History Family History  Problem Relation Age of Onset   Heart attack Father    Hypertension Mother    Hyperlipidemia Mother    Hyperkalemia Mother    Lung cancer Maternal Uncle        smoker    Social History Social History   Tobacco Use   Smoking status: Former    Types: Cigarettes    Quit date: 12/16/1993    Years since quitting: 28.0   Smokeless tobacco: Never  Vaping Use   Vaping Use: Never used  Substance Use Topics   Alcohol use: No   Drug use: Not Currently    Types: Marijuana    Comment: 03-2018 smokes daily     Allergies   Eggs or egg-derived products   Review of Systems Review of Systems  Constitutional:  Positive for fatigue. Negative for chills and fever.  Eyes:  Negative for discharge and redness.  Respiratory:  Positive for shortness of breath.   Cardiovascular:  Positive for chest pain.  Neurological:  Negative for numbness.    Physical Exam Triage Vital Signs ED Triage Vitals  Enc Vitals Group     BP 01/01/22 1703 137/69     Pulse Rate 01/01/22 1709 (!) 46     Resp --      Temp 01/01/22 1703 98.4 F (36.9 C)     Temp Source 01/01/22 1703 Oral     SpO2 01/01/22 1703 96 %     Weight --      Height --      Head Circumference --      Peak Flow --      Pain Score 01/01/22 1705 10     Pain Loc --      Pain Edu? --      Excl. in [place]? --    No data found.  Updated Vital Signs BP 137/69 (BP Location: Left Arm)    Pulse (!) 46    Temp 98.4 F (36.9 C) (Oral)    SpO2 96%   Physical Exam Vitals  and nursing note reviewed.  Constitutional:      General: He is not in acute distress.    Appearance: Normal appearance. He is not ill-appearing.  HENT:     Head: Normocephalic and atraumatic.  Eyes:     Conjunctiva/sclera: Conjunctivae normal.  Cardiovascular:     Rate and Rhythm: Bradycardia present.     Heart sounds: Normal heart sounds. No murmur heard. Pulmonary:     Effort: Pulmonary effort is normal. No respiratory distress.     Breath sounds: Normal breath sounds. No wheezing, rhonchi or rales.  Neurological:     Mental Status: He is alert.  Psychiatric:        Mood and Affect: Mood normal.        Behavior: Behavior normal.        Thought Content: Thought content normal.     UC Treatments / Results  Labs (all labs ordered are listed, but only abnormal results are displayed) Labs Reviewed - No data to display  EKG   Radiology No results found.  Procedures Procedures (including critical care time)  Medications Ordered in UC Medications - No data to display  Initial Impression / Assessment and Plan / UC Course  I have reviewed the triage vital signs and the nursing notes.  Pertinent labs & imaging results that were available during my care of the patient were reviewed by me and considered in my medical decision making (see chart for details).     Unknown cause of symptoms- patient is slightly more bradycardic than in the past. Recommended further evaluation in the ED for stat labs. Patient is agreeable to same.   Final Clinical Impressions(s) / UC Diagnoses   Final diagnoses:  Chest discomfort   Discharge Instructions   None    ED Prescriptions   None    PDMP not reviewed this encounter.   Francene Finders, PA-C 01/01/22 1737

## 2022-01-01 NOTE — ED Provider Notes (Signed)
[facility] EMERGENCY DEPT Provider Note   CSN: [id number] Arrival date & time: 01/01/22  1758     History  Chief Complaint  Patient presents with   Chest Pain    Clinton Allen is a 51 y.o. male. PMH includes bipolar I disorder.  Patient presents with a chief complaint of chest pain.  Apparently over the past 3 to 4 days he has been having intermittent chest pains in the left side of his chest.  It does not radiate.  He describes the symptoms as moderate may be 4 out of 10 in intensity.  They usually go away on their own.  They are not associated with exertion.  He has no other associated symptoms.  He states that he is recently got over a respiratory illness prior to developing symptoms.  He denies any fevers, chills, or shortness of breath.  He has had an increase in anxiety due to the passing away of his mother recently.  He was referred here by urgent care for his symptoms.   Chest Pain     Home Medications Prior to Admission medications   Medication Sig Start Date End Date Taking? Authorizing Provider  doxycycline (VIBRA-TABS) 100 MG tablet Take 100 mg by mouth 2 (two) times daily. 12/27/21   [provider]      Allergies    Eggs or egg-derived products    Review of Systems   Review of Systems  Cardiovascular:  Positive for chest pain.  All other systems reviewed and are negative.  Physical Exam Updated Vital Signs BP 135/75    Pulse (!) 50    Temp 97.8 F (36.6 C)    Resp 16    Ht 5\' 11"  (1.803 m)    Wt 86.2 kg    SpO2 100%    BMI 26.50 kg/m  Physical Exam Vitals and nursing note reviewed.  Constitutional:      General: He is not in acute distress.    Appearance: Normal appearance. He is well-developed. He is not ill-appearing, toxic-appearing or diaphoretic.  HENT:     Head: Normocephalic and atraumatic.     Nose: No nasal deformity.     Mouth/Throat:     Lips: Pink. No lesions.  Eyes:     General: Gaze aligned appropriately. No  scleral icterus.       Right eye: No discharge.        Left eye: No discharge.     Conjunctiva/sclera: Conjunctivae normal.     Right eye: Right conjunctiva is not injected. No exudate or hemorrhage.    Left eye: Left conjunctiva is not injected. No exudate or hemorrhage. Cardiovascular:     Rate and Rhythm: Regular rhythm. Bradycardia present.     Pulses:          Radial pulses are 2+ on the right side and 2+ on the left side.       Dorsalis pedis pulses are 2+ on the right side and 2+ on the left side.     Heart sounds: Normal heart sounds. No murmur heard.   No friction rub. No gallop. No S3 or S4 sounds.  Pulmonary:     Effort: Pulmonary effort is normal. No tachypnea, accessory muscle usage or respiratory distress.     Breath sounds: No stridor. No decreased breath sounds, wheezing, rhonchi or rales.  Chest:     Chest wall: No tenderness.  Abdominal:     General: There is no abdominal bruit.  Tenderness: There is no guarding or rebound.  Musculoskeletal:     Right lower leg: No edema.     Left lower leg: No edema.  Skin:    General: Skin is warm and dry.  Neurological:     Mental Status: He is alert and oriented to person, place, and time.  Psychiatric:        Mood and Affect: Mood normal.        Speech: Speech normal.        Behavior: Behavior normal. Behavior is cooperative.    ED Results / Procedures / Treatments   Labs (all labs ordered are listed, but only abnormal results are displayed) Labs Reviewed  BASIC METABOLIC PANEL - Abnormal; Notable for the following components:      Result Value   Creatinine, Ser 1.29 (*)    All other components within normal limits  RESP PANEL BY RT-PCR (FLU A&B, COVID) ARPGX2  CBC  TROPONIN I (HIGH SENSITIVITY)  TROPONIN I (HIGH SENSITIVITY)    EKG None  Radiology DG Chest 2 View  Result Date: 01/01/2022 CLINICAL DATA:  Chest pain EXAM: CHEST - 2 VIEW COMPARISON:  08/28/2020 FINDINGS: Heart and mediastinal contours are  within normal limits. No focal opacities or effusions. No acute bony abnormality. IMPRESSION: No active cardiopulmonary disease. Electronically Signed   By: Charlett Nose M.D.   On: 01/01/2022 18:35    Procedures Procedures  Cardiac monitoring in ED  Medications Ordered in ED Medications - No data to display  ED Course/ Medical Decision Making/ A&P                           Medical Decision Making Amount and/or Complexity of Data Reviewed Labs: ordered. Decision-making details documented in ED Course. Radiology: ordered and independent interpretation performed. Decision-making details documented in ED Course. ECG/medicine tests: ordered and independent interpretation performed. Decision-making details documented in ED Course.   This is a 51 y.o. male with a PMH of bipolar I who presents to the ED with intermittent chest pains over the past week.   Chest pain is atypical and is not associated with exertion.  Patient sniffily states he gets better when he is running around.  He is in excellent shape.  His vitals have sinus bradycardia, however reviewed past records and he routinely has a heart rate in the 40s.  Cardiac work-up initiated to rule out ACS.  Patient is asymptomatic at this time.  I personally reviewed all laboratory work and imaging. Abnormal results outlined below.  CBC reassuring, BMP with stable creat, Troponin negative. Resp panel negative.  EKG was reviewed and reveals sinus bradycardia.  Chest x-ray without abnormalities.  I do not feel that patient symptoms are cardiac in etiology.  He has had a lot of increased stress lately, so could be related to this.  Recommend following up with his primary care provider.  If he develops worsening symptoms or chest pain with exertion, he needs to return to the emergency department.  Portions of this note were generated with Scientist, clinical (histocompatibility and immunogenetics). Dictation errors may occur despite best attempts at proofreading.  Final Clinical  Impression(s) / ED Diagnoses Final diagnoses:  Nonspecific chest pain    Rx / DC Orders ED Discharge Orders     None         Claudie Leach, PA-C 01/01/22 2218    Ernie Avena, MD 01/02/22 1144

## 2022-01-01 NOTE — Discharge Instructions (Signed)
Your labs and imaging were normal. They were not concerning for a cardiac cause to your symptoms. Please follow up with your primary care doctor regarding your symptoms.

## 2022-01-01 NOTE — ED Triage Notes (Signed)
Patient c/o left sided chest pain x 5 days.  Patient describes the pain as "uncomfortable".  Some SOB.

## 2022-01-01 NOTE — ED Notes (Signed)
Pt discharged to home. Discharge instructions have been discussed with patient and/or family members. Pt verbally acknowledges understanding d/c instructions, and endorses comprehension to checkout at registration before leaving.  °

## 2022-01-01 NOTE — ED Triage Notes (Signed)
Patient here POV from UC for CP.  CP has been present and worsening for 5 days. Pain is Generalized Throughout.  UC EKG displays SB. No N/V/D on Sunday. Family recently Ill with Flu-Like Symptoms.   NAD Noted during Triage. A&Ox4. GCS 15. Ambulatory.

## 2022-03-06 ENCOUNTER — Other Ambulatory Visit: Payer: Self-pay

## 2022-03-06 ENCOUNTER — Emergency Department
Admission: RE | Admit: 2022-03-06 | Discharge: 2022-03-06 | Disposition: A | Discharge status: Home / Self Care | Payer: Medicaid Other | Source: Ambulatory Visit | Attending: Family Medicine | Admitting: Family Medicine

## 2022-03-06 VITALS — BP 115/68 | HR 53 | Temp 98.6°F | Resp 18 | Ht 71.0 in | Wt 197.0 lb

## 2022-03-06 DIAGNOSIS — H1013 Acute atopic conjunctivitis, bilateral: Secondary | ICD-10-CM

## 2022-03-06 MED ORDER — PREDNISONE 20 MG PO TABS: ORAL_TABLET | ORAL | 0 refills | Status: DC

## 2022-03-06 MED ORDER — TOBRAMYCIN 0.3 % OP SOLN: 1.0000 [drp] | OPHTHALMIC | 0 refills | Status: DC

## 2022-03-06 NOTE — ED Triage Notes (Signed)
Pt states that he has some bilateral eye redness and itching. X1 day ?

## 2022-03-06 NOTE — ED Provider Notes (Signed)
?[facility] ? ? ? ?CSN: [id number] ?Arrival date & time: 03/06/22  1521 ? ? ?  ? ?History   ?Chief Complaint ?Chief Complaint  ?Patient presents with  ? Eye Problem  ?  Bilateral eye redness and itching x1 day  ? ? ?HPI ?Clinton Allen is a 51 y.o. male.  ? ?HPI ?Patient states that he woke up this morning with itchy eyes and swelling of his lids.  Tearing.  No light sensitivity.  He has allergies.  Usually does not have this much eye irritation.  Denies any runny nose, sneezing, cough at this time ? ?Past Medical History:  ?Diagnosis Date  ? Allergic rhinitis   ? Bipolar 1 disorder ([place])   ? Bronchitis   ? Complication of anesthesia   ? allergy to eggs  ? Degenerative lumbar disc   ? Erectile dysfunction   ? Hammer toe   ? Marijuana abuse   ? 03-2018 smokes daily  ? Neck fracture ([place])   ? Vitamin D deficiency   ? ? ?Patient Active Problem List  ? Diagnosis Date Noted  ? Encounter for screening colonoscopy 07/29/2016  ? Constipation 07/29/2016  ? Abdominal bloating 07/29/2016  ? Elevated LFTs 07/29/2016  ? Elevated lipase 07/29/2016  ? Status post right foot surgery 07/31/2015  ? Status post left foot surgery 05/02/2015  ? Porokeratosis 04/17/2015  ? 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 Pain in lower limb 04/17/2015  ? Foot pain 01/02/2014  ? Chronic pain associated with significant psychosocial dysfunction 01/02/2014  ? Bipolar I disorder ([place]) 06/30/2013  ? History of surgical procedure 06/30/2013  ? Cannabis abuse 06/30/2013  ? 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 ? ?Past Surgical History:  ?Procedure Laterality Date  ? Cotton Osteotomy w/ Bone Graft Left 04/27/2015  ? Cotton Osteotomy w/ Bone Graft Right 06/29/2015  ? CYST EXCISION Right 04/08/2018  ? Procedure: [id number] CYST FROM RIGHT BUTTOCKS ERAS PATHWAY;  Surgeon: Johnathan Hausen, MD;  Location: [facility];  Service: General;  Laterality: Right;  ? cyst removed from buttocks    ? 04-08-18 Dr. Hassell Done  ? Hammer Toe Repair Left 04/27/2015  ? Lt #2  ?  HERNIA REPAIR    ? umbilical  ? NECK SURGERY    ? TOE SURGERY Bilateral   ? ? ? ? ? ?Home Medications   ? ?Prior to Admission medications   ?Medication Sig Start Date End Date Taking? Authorizing Provider  ?Loratadine (CLARITIN PO) Take by mouth.   Yes [provider]  ?predniSONE (DELTASONE) 20 MG tablet Take 40 mg by mouth daily with food 03/06/22  Yes Raylene Everts, MD  ?tobramycin (TOBREX) 0.3 % ophthalmic solution Place 1 drop into both eyes every 4 (four) hours. 03/06/22  Yes Raylene Everts, MD  ?Vitamin D, Ergocalciferol, (DRISDOL) 1.25 MG (50000 UNIT) CAPS capsule Take 50,000 Units by mouth once a week. 01/22/22  Yes [provider]  ? ? ?Family History ?Family History  ?Problem Relation Age of Onset  ? Heart attack Father   ? Hypertension Mother   ? Hyperlipidemia Mother   ? Hyperkalemia Mother   ? Lung cancer Maternal Uncle   ?     smoker  ? ? ?Social History ?Social History  ? ?Tobacco Use  ? Smoking status: Former  ?  Types: Cigarettes  ?  Quit date: 12/16/1993  ?  Years since quitting: 28.2  ? Smokeless tobacco: Never  ?Vaping Use  ? Vaping Use: Never used  ?Substance  Use Topics  ? Alcohol use: No  ? Drug use: Not Currently  ?  Types: Marijuana  ?  Comment: 03-2018 smokes daily  ? ? ? ?Allergies   ?Eggs or egg-derived products ? ? ?Review of Systems ?Review of Systems ?See HPI ? ?Physical Exam ?Triage Vital Signs ?ED Triage Vitals  ?Enc Vitals Group  ?   BP 03/06/22 1544 115/68  ?   Pulse Rate 03/06/22 1544 (!) 53  ?   Resp 03/06/22 1544 18  ?   Temp 03/06/22 1544 98.6 ?F (37 ?C)  ?   Temp src --   ?   SpO2 03/06/22 1544 98 %  ?   Weight 03/06/22 1541 197 lb (89.4 kg)  ?   Height 03/06/22 1541 5\' 11"  (1.803 m)  ?   Head Circumference --   ?   Peak Flow --   ?   Pain Score 03/06/22 1540 0  ?   Pain Loc --   ?   Pain Edu? --   ?   Excl. in [place]? --   ? ?No data found. ? ?Updated Vital Signs ?BP 115/68 (BP Location: Left Arm)   Pulse (!) 53   Temp 98.6 ?F (37 ?C)   Resp 18   Ht 5\' 11"   (1.803 m)   Wt 89.4 kg   SpO2 98%   BMI 27.48 kg/m?  ?   ? ?Physical Exam ?Constitutional:   ?   General: He is not in acute distress. ?   Appearance: He is well-developed and normal weight.  ?HENT:  ?   Head: Normocephalic and atraumatic.  ?   Right Ear: Tympanic membrane and ear canal normal.  ?   Left Ear: Tympanic membrane and ear canal normal.  ?   Nose: Nose normal. No congestion.  ?   Mouth/Throat:  ?   Pharynx: No posterior oropharyngeal erythema.  ?Eyes:  ?   General:     ?   Right eye: Discharge present.     ?   Left eye: Discharge present. ?   Pupils: Pupils are equal, round, and reactive to light.  ?   Comments: Mild conjunctival injection bilaterally.  Mild swelling of upper and lower lids.  Clear eye discharge.   ?Cardiovascular:  ?   Rate and Rhythm: Normal rate.  ?Pulmonary:  ?   Effort: Pulmonary effort is normal. No respiratory distress.  ?Abdominal:  ?   General: There is no distension.  ?   Palpations: Abdomen is soft.  ?Musculoskeletal:     ?   General: Normal range of motion.  ?   Cervical back: Normal range of motion.  ?Skin: ?   General: Skin is warm and dry.  ?Neurological:  ?   Mental Status: He is alert.  ?Psychiatric:     ?   Mood and Affect: Mood normal.     ?   Behavior: Behavior normal.  ? ? ? ?UC Treatments / Results  ?Labs ?(all labs ordered are listed, but only abnormal results are displayed) ?Labs Reviewed - No data to display ? ?EKG ? ? ?Radiology ?No results found. ? ?Procedures ?Procedures (including critical care time) ? ?Medications Ordered in UC ?Medications - No data to display ? ?Initial Impression / Assessment and Plan / UC Course  ?I have reviewed the triage vital signs and the nursing notes. ? ?Pertinent labs & imaging results that were available during my care of the patient were reviewed by me and considered in  my medical decision making (see chart for details). ? ?  ? ?I believe patient is having allergic eye complaints.  We will give him a few days of prednisone.   Continue on Claritin.  Discussed allergy eyedrop such as Opcon-A (over-the-counter).  He feels he has pinkeye.  We will give him Tobrex to use until eye is clear but my suspicion is allergy ?Final Clinical Impressions(s) / UC Diagnoses  ? ?Final diagnoses:  ?Allergic conjunctivitis of both eyes  ? ? ? ?Discharge Instructions   ? ?  ?Continue claritin daily ?Add prednisone once a day for 5 days ?Use eye drops until eyes feel better, probably 5-7 days ? ? ?ED Prescriptions   ? ? Medication Sig Dispense Auth. Provider  ? predniSONE (DELTASONE) 20 MG tablet Take 40 mg by mouth daily with food 10 tablet Raylene Everts, MD  ? tobramycin (TOBREX) 0.3 % ophthalmic solution Place 1 drop into both eyes every 4 (four) hours. 5 mL Raylene Everts, MD  ? ?  ? ?PDMP not reviewed this encounter. ?  ?Raylene Everts, MD ?03/06/22 1617 ? ?

## 2022-03-06 NOTE — Discharge Instructions (Signed)
Continue claritin daily ?Add prednisone once a day for 5 days ?Use eye drops until eyes feel better, probably 5-7 days ?

## 2022-06-13 ENCOUNTER — Ambulatory Visit
Admission: EM | Admit: 2022-06-13 | Discharge: 2022-06-13 | Disposition: A | Discharge status: Home / Self Care | Payer: Medicaid Other | Attending: Emergency Medicine | Admitting: Emergency Medicine

## 2022-06-13 DIAGNOSIS — H1013 Acute atopic conjunctivitis, bilateral: Secondary | ICD-10-CM

## 2022-06-13 MED ORDER — OLOPATADINE HCL 0.2 % OP SOLN: 1.0000 [drp] | Freq: Every day | OPHTHALMIC | 0 refills | Status: AC

## 2022-06-13 MED ORDER — SYSTANE 0.4-0.3 % OP SOLN: 1.0000 [drp] | Freq: Four times a day (QID) | OPHTHALMIC | 0 refills | Status: AC | PRN

## 2022-06-13 MED ORDER — SYSTANE 0.4-0.3 % OP SOLN: 1.0000 [drp] | Freq: Four times a day (QID) | OPHTHALMIC | 0 refills | Status: DC | PRN

## 2022-06-13 MED ORDER — CETIRIZINE HCL 10 MG PO TABS
10.0000 mg | ORAL_TABLET | Freq: Every day | ORAL | 0 refills | Status: AC
Start: 2022-06-13 — End: 2024-05-26

## 2022-06-13 MED ORDER — CETIRIZINE HCL 10 MG PO TABS: 10.0000 mg | ORAL_TABLET | Freq: Every day | ORAL | 0 refills | Status: DC

## 2022-06-13 MED ORDER — OLOPATADINE HCL 0.2 % OP SOLN: 1.0000 [drp] | Freq: Every day | OPHTHALMIC | 0 refills | Status: DC

## 2022-06-13 NOTE — ED Provider Notes (Signed)
HPI  SUBJECTIVE:  Clinton Allen is a 51 y.o. male who presents with bilateral eye itching, irritation, increased tearing , burning worse on the right for the past 4 days.  He reports an itchy throat, white discharge from his eyes.  He states that his eyes feel gritty, as if there is sand in them, and reports conjunctival injection.  States that he has been rubbing his eyes a lot.  He has had similar symptoms for the past 1 to 2 years.  No nasal congestion, sneezing, rhinorrhea, postnasal drip, visual changes, photophobia, pain with extraocular movements, contacts with pinkeye, trauma to the eye.  He wears glasses.  He does not wear contacts.  He has been using tobramycin drops for the past 2 days without improvement in his symptoms.  Symptoms worse with exposure to air conditioning in his car.  He has a past medical history of allergies for which he takes Claritin.  No history of diabetes, hypertension, glaucoma.  PCP: [facility]: None.    Past Medical History:  Diagnosis Date   Allergic rhinitis    Bipolar 1 disorder (HCC)    Bronchitis    Complication of anesthesia    allergy to eggs   Degenerative lumbar disc    Erectile dysfunction    Hammer toe    Marijuana abuse    03-2018 smokes daily   Neck fracture (HCC)    Vitamin D deficiency     Past Surgical History:  Procedure Laterality Date   Cotton Osteotomy w/ Bone Graft Left 04/27/2015   Cotton Osteotomy w/ Bone Graft Right 06/29/2015   CYST EXCISION Right 04/08/2018   Procedure: EXCISITON CYST FROM RIGHT BUTTOCKS ERAS PATHWAY;  Surgeon: Luretha Murphy, MD;  Location: [facility];  Service: General;  Laterality: Right;   cyst removed from buttocks     04-08-18 Dr. France Ravens Toe Repair Left 04/27/2015   Lt #2   HERNIA REPAIR     umbilical   NECK SURGERY     TOE SURGERY Bilateral     Family History  Problem Relation Age of Onset   Heart attack Father    Hypertension Mother    Hyperlipidemia Mother     Hyperkalemia Mother    Lung cancer Maternal Uncle        smoker    Social History   Tobacco Use   Smoking status: Former    Types: Cigarettes    Quit date: 12/16/1993    Years since quitting: 28.5   Smokeless tobacco: Never  Vaping Use   Vaping Use: Never used  Substance Use Topics   Alcohol use: No   Drug use: Not Currently    Types: Marijuana    Comment: 03-2018 smokes daily    No current facility-administered medications for this encounter.  Current Outpatient Medications:    cetirizine (ZYRTEC ALLERGY) 10 MG tablet, Take 1 tablet (10 mg total) by mouth daily., Disp: 30 tablet, Rfl: 0   Olopatadine HCl 0.2 % SOLN, Apply 1 drop to eye daily., Disp: 2.5 mL, Rfl: 0   Polyethyl Glycol-Propyl Glycol (SYSTANE) 0.4-0.3 % SOLN, Apply 1 drop to eye 4 (four) times daily as needed., Disp: 5 mL, Rfl: 0   Vitamin D, Ergocalciferol, (DRISDOL) 1.25 MG (50000 UNIT) CAPS capsule, Take 50,000 Units by mouth once a week., Disp: , Rfl:   Allergies  Allergen Reactions   Eggs Or Egg-Derived Products Anaphylaxis     ROS  As noted in HPI.   Physical  Exam  BP 131/76 (BP Location: Left Arm)   Pulse (!) 51   Temp 98.4 F (36.9 C) (Oral)   Resp 18   SpO2 96%   Constitutional: Well developed, well nourished, no acute distress Eyes:  EOMI, conjunctiva normal bilaterally.  PERRLA, no direct or consensual photophobia.  No discharge.  No pain with EOMs.  No foreign body seen on lid eversion.  No corneal abrasion seen on flourescin exam.  No periorbital erythema, edema. HENT: Normocephalic, atraumatic,mucus membranes moist Respiratory: Normal inspiratory effort Cardiovascular: Normal rate GI: nondistended skin: No rash, skin intact Musculoskeletal: no deformities Neurologic: Alert & oriented x 3, no focal neuro deficits Psychiatric: Speech and behavior appropriate   ED Course   Medications - No data to display  No orders of the defined types were placed in this encounter.   No  results found for this or any previous visit (from the past 24 hour(s)). No results found.  ED Clinical Impression  1. Allergic conjunctivitis of both eyes      ED Assessment/Plan  Suspect allergic conjunctivitis or dry eye syndrome given his history of allergies and the chronicity of symptoms.  Will send home with Systane, Pataday.  Discontinue Claritin, start Zyrtec.  Follow-up with Dr. Henreitta Cea, ophthalmology on-call if this does not help. Discussed MDM, treatment plan, and plan for follow-up with patient. patient agrees with plan.   Meds ordered this encounter  Medications   DISCONTD: cetirizine (ZYRTEC ALLERGY) 10 MG tablet    Sig: Take 1 tablet (10 mg total) by mouth daily.    Dispense:  30 tablet    Refill:  0   DISCONTD: Olopatadine HCl 0.2 % SOLN    Sig: Apply 1 drop to eye daily.    Dispense:  2.5 mL    Refill:  0   DISCONTD: Polyethyl Glycol-Propyl Glycol (SYSTANE) 0.4-0.3 % SOLN    Sig: Apply 1 drop to eye 4 (four) times daily as needed.    Dispense:  5 mL    Refill:  0   cetirizine (ZYRTEC ALLERGY) 10 MG tablet    Sig: Take 1 tablet (10 mg total) by mouth daily.    Dispense:  30 tablet    Refill:  0   Olopatadine HCl 0.2 % SOLN    Sig: Apply 1 drop to eye daily.    Dispense:  2.5 mL    Refill:  0   Polyethyl Glycol-Propyl Glycol (SYSTANE) 0.4-0.3 % SOLN    Sig: Apply 1 drop to eye 4 (four) times daily as needed.    Dispense:  5 mL    Refill:  0      *This clinic note was created using Scientist, clinical (histocompatibility and immunogenetics). Therefore, there may be occasional mistakes despite careful proofreading.  ?    Domenick Gong, MD 06/14/22 [phone]

## 2022-06-13 NOTE — Discharge Instructions (Addendum)
Discontinue Claritin.  Try Zyrtec instead.  Systane as often as you want.  Pataday eyedrops will help with the itching.  Please follow-up with ophthalmology if this does not make you better.

## 2022-06-13 NOTE — ED Triage Notes (Signed)
Pt taking a call on phone and would not hang up call to complete triage.

## 2022-09-09 ENCOUNTER — Other Ambulatory Visit: Payer: Self-pay | Admitting: Internal Medicine

## 2022-09-11 LAB — INFLUENZA A AND B AG, IMMUNOASSAY
INFLUENZA A ANTIGEN: NOT DETECTED
INFLUENZA B ANTIGEN: NOT DETECTED
MICRO NUMBER:: 13965269
SPECIMEN QUALITY:: ADEQUATE

## 2022-09-11 LAB — SARS-COV-2 RNA,(COVID-19) QUALITATIVE NAAT: SARS CoV2 RNA: NOT DETECTED

## 2023-04-15 ENCOUNTER — Ambulatory Visit: Payer: Medicaid Other | Admitting: Podiatry

## 2023-04-15 DIAGNOSIS — Q666 Other congenital valgus deformities of feet: Secondary | ICD-10-CM

## 2023-04-15 DIAGNOSIS — Q828 Other specified congenital malformations of skin: Secondary | ICD-10-CM | POA: Diagnosis not present

## 2023-04-15 NOTE — Progress Notes (Signed)
  Subjective:  Patient ID: Clinton Allen, male    DOB: 01-Nov-1971,  MRN: [id number]  Chief Complaint  Patient presents with   Foot Pain    52 y.o. male presents with the above complaint.  Patient presents with complaint of bilateral submetatarsal 5 porokeratosis with pain on ambulation.  He states is painful to touch is progressive gotten worse worse with ambulation worse with pressure he also states that he does not wear any kind of orthotics he wears regular shoes.  He would like to discuss treatment options every single day.   Review of Systems: Negative except as noted in the HPI. Denies N/V/F/Ch.  Past Medical History:  Diagnosis Date   Allergic rhinitis    Bipolar 1 disorder (HCC)    Bronchitis    Complication of anesthesia    allergy to eggs   Degenerative lumbar disc    Erectile dysfunction    Hammer toe    Marijuana abuse    03-2018 smokes daily   Neck fracture (HCC)    Vitamin D deficiency     Current Outpatient Medications:    cetirizine (ZYRTEC ALLERGY) 10 MG tablet, Take 1 tablet (10 mg total) by mouth daily., Disp: 30 tablet, Rfl: 0   Olopatadine HCl 0.2 % SOLN, Apply 1 drop to eye daily., Disp: 2.5 mL, Rfl: 0   Polyethyl Glycol-Propyl Glycol (SYSTANE) 0.4-0.3 % SOLN, Apply 1 drop to eye 4 (four) times daily as needed., Disp: 5 mL, Rfl: 0   Vitamin D, Ergocalciferol, (DRISDOL) 1.25 MG (50000 UNIT) CAPS capsule, Take 50,000 Units by mouth once a week., Disp: , Rfl:   Social History   Tobacco Use  Smoking Status Former   Types: Cigarettes   Quit date: 12/16/1993   Years since quitting: 29.3  Smokeless Tobacco Never    Allergies  Allergen Reactions   Egg-Derived Products Anaphylaxis   Objective:  There were no vitals filed for this visit. There is no height or weight on file to calculate BMI. Constitutional Well developed. Well nourished.  Vascular Dorsalis pedis pulses palpable bilaterally. Posterior tibial pulses palpable bilaterally. Capillary  refill normal to all digits.  No cyanosis or clubbing noted. Pedal hair growth normal.  Neurologic Normal speech. Oriented to person, place, and time. Epicritic sensation to light touch grossly present bilaterally.  Dermatologic Nails well groomed and normal in appearance. No open wounds. No skin lesions.  Orthopedic: Bilateral submetatarsal 5 porokeratotic lesion with central nucleated core noted pain on palpation to the lesion.  Pes planovalgus foot deformity noted with calcaneovalgus to many toe signs unable to recruit the arch with dorsiflexion of the hallux   Radiographs: None Assessment:   1. Pes planovalgus   2. Porokeratosis    Plan:  Patient was evaluated and treated and all questions answered.  Bilateral pes planovalgus with underlying porokeratotic lesion -I explained to patient the etiology of pes planovalgus and relationship with Planter fasciitis and various treatment options were discussed.  Given patient foot structure in the setting of Planter fasciitis I believe patient will benefit from custom-made orthotics to help control the hindfoot motion support the arch of the foot and take the stress away from plantar fascial.  Patient agrees with the plan like to proceed with orthotics -Patient was casted for orthotics   No follow-ups on file.

## 2023-06-09 ENCOUNTER — Other Ambulatory Visit: Payer: Medicaid Other

## 2023-06-13 ENCOUNTER — Ambulatory Visit (INDEPENDENT_AMBULATORY_CARE_PROVIDER_SITE_OTHER): Payer: Medicaid Other | Admitting: Podiatrist

## 2023-06-13 DIAGNOSIS — Q666 Other congenital valgus deformities of feet: Secondary | ICD-10-CM

## 2023-06-13 NOTE — Progress Notes (Signed)
SITUATION: Reason for Visit:         Fitting and Delivery of Custom Fabricated Foot Orthoses Patient Report:            Patient reports comfort and is satisfied with device.   OBJECTIVE DATA: Patient History / Diagnosis:    No change in pathology Provided Device:                     Functional foot orthoses   GOAL OF ORTHOSIS - Improve gait - Decrease energy expenditure - Improve Balance - Provide Triplanar stability of foot complex - Facilitate motion   ACTIONS PERFORMED Patient was fit with custom foot orthoses   Patient was provided with verbal and written instruction and demonstration regarding donning, doffing, wear, care, proper fit, function, purpose, cleaning, and use of the orthosis and in all related precautions and risks and benefits regarding the orthosis.   Patient was also provided with verbal instruction regarding how to report any failures or malfunctions of the orthosis and necessary follow up care. Patient was also instructed to contact our office regarding any change in status that may affect the function of the orthosis.   Patient demonstrated independence with proper donning, doffing, and fit and verbalized understanding of all instructions.

## 2023-08-06 ENCOUNTER — Other Ambulatory Visit: Payer: Self-pay | Admitting: Internal Medicine

## 2023-08-07 LAB — URINE CULTURE
MICRO NUMBER:: 15362617
Result:: NO GROWTH
SPECIMEN QUALITY:: ADEQUATE

## 2023-08-07 LAB — EXTRA URINE SPECIMEN

## 2023-08-07 LAB — URINALYSIS, ROUTINE W REFLEX MICROSCOPIC

## 2023-10-09 ENCOUNTER — Encounter: Payer: Self-pay | Admitting: Podiatry

## 2023-10-09 ENCOUNTER — Ambulatory Visit: Payer: Medicaid Other | Admitting: Podiatry

## 2023-10-09 VITALS — Ht 71.0 in | Wt 197.0 lb

## 2023-10-09 DIAGNOSIS — M21969 Unspecified acquired deformity of unspecified lower leg: Secondary | ICD-10-CM

## 2023-10-09 DIAGNOSIS — Q828 Other specified congenital malformations of skin: Secondary | ICD-10-CM

## 2023-10-09 DIAGNOSIS — M2042 Other hammer toe(s) (acquired), left foot: Secondary | ICD-10-CM

## 2023-10-09 DIAGNOSIS — M2041 Other hammer toe(s) (acquired), right foot: Secondary | ICD-10-CM

## 2023-10-10 DIAGNOSIS — M2040 Other hammer toe(s) (acquired), unspecified foot: Secondary | ICD-10-CM | POA: Insufficient documentation

## 2023-10-10 NOTE — Progress Notes (Signed)
This patient presents to the office  with painful corns and calluses both feet. He has had history of foot surgery both feet according to patient.  He says he has painful callus  after having his callus worked on five days ago.  He says the pain in his calluses and corns has already returned.  He presents to the office for evaluation and treatment.  Vascular  Dorsalis pedis and posterior tibial pulses are palpable  B/L.  Capillary return  WNL.  Temperature gradient is  WNL.  Skin turgor  WNL  Sensorium  Senn Weinstein monofilament wire  WNL. Normal tactile sensation.  Nail Exam  Patient has normal nails with no evidence of bacterial or fungal infection.  Orthopedic  Exam  Muscle tone and muscle strength  WNL.  No limitations of motion feet  B/L.  No crepitus or joint effusion noted.  Foot type is unremarkable and digits show no abnormalities.  Plantar flexed metatarsal  B/L.  Hammer toe  B/L.  Skin  No open lesions.  Normal skin texture and turgor.  Callus noted sub 2,5  B/L and listers corn fifth toe right foot. Porokeratosis noted on heels  B/L.  Calluses both feet.  Porokeratosis  B/L.   ROV  Discussed this condition with this patient.  Told him the calluses are minimal.  His major problem is the bones in his forefeet causing his pain.  He needs to make an appointment with one of the surgical doctors for evaluation and possible surgery. Helane Gunther DPM

## 2023-11-20 ENCOUNTER — Ambulatory Visit (INDEPENDENT_AMBULATORY_CARE_PROVIDER_SITE_OTHER): Payer: Medicaid Other

## 2023-11-20 ENCOUNTER — Encounter: Payer: Self-pay | Admitting: Podiatry

## 2023-11-20 ENCOUNTER — Ambulatory Visit: Payer: Medicaid Other | Admitting: Podiatry

## 2023-11-20 DIAGNOSIS — M216X2 Other acquired deformities of left foot: Secondary | ICD-10-CM

## 2023-11-20 DIAGNOSIS — Q828 Other specified congenital malformations of skin: Secondary | ICD-10-CM

## 2023-11-20 DIAGNOSIS — M216X1 Other acquired deformities of right foot: Secondary | ICD-10-CM

## 2023-11-20 DIAGNOSIS — M7741 Metatarsalgia, right foot: Secondary | ICD-10-CM

## 2023-11-20 DIAGNOSIS — M779 Enthesopathy, unspecified: Secondary | ICD-10-CM

## 2023-11-20 DIAGNOSIS — M7751 Other enthesopathy of right foot: Secondary | ICD-10-CM

## 2023-11-20 DIAGNOSIS — M7752 Other enthesopathy of left foot: Secondary | ICD-10-CM | POA: Diagnosis not present

## 2023-11-20 MED ORDER — SALICYLIC ACID 40 % EX STRP: 1.0000 | ORAL_STRIP | Freq: Every day | CUTANEOUS | 3 refills | Status: AC

## 2023-11-20 MED ORDER — CLOBETASOL PROPIONATE 0.05 % EX CREA: 1.0000 | TOPICAL_CREAM | Freq: Two times a day (BID) | CUTANEOUS | 0 refills | Status: AC

## 2023-11-20 NOTE — Progress Notes (Signed)
  Subjective:  Patient ID: Clinton Allen, male    DOB: [birth year],  MRN: [id number]  Chief Complaint  Patient presents with   Foot Pain    PATIENT STATES HE HAD FOOT SURGERY IN 2005 AND HE WENT TO SEE ANOTHER DOCTOR AND THEY TOLD HIM THAT THEY MESSED UP ON HIS FEET AND THAT IS WHY HE IS HERE TO GET HIS FEET CHECKED OUT.  PATIENT STATES HE HAS PAIN EVERY WHERE ON THE BOTTOM OF HIS FEET , AND ALSO HAS CALLOUSES AT THE BOTTOM OF BILATERAL FEET.    Discussed the use of AI scribe software for clinical note transcription with the patient, who gave verbal consent to proceed.  History of Present Illness   The patient, with a history of multiple foot surgeries, presents with severe foot pain that prevents him from wearing shoes and working. He describes the pain as originating from sores in his feet that recur every three days. He has someone who 'digs down' into his feet to remove the sores, which provides temporary relief. He also has similar sores on his hands. He has tried various treatments, including $600 insoles and unnamed medications, without success. He has also seen a dermatologist, who was unable to provide relief. The patient is seeking disability due to his inability to work, but has been unsuccessful so far.          Objective:    Physical Exam   EXTREMITIES: Feet warm and well perfused with good capillary refill time. MUSCULOSKELETAL: Diffuse tenderness to areas of skin lesions, no gross bony deformity SKIN: Multiple painful pinpoint keratotic lesions on palms and soles.       No images are attached to the encounter.    Results   RADIOLOGY Foot X-ray: Previous osteotomy of multiple metatarsals with no acute osseous abnormalities. Foot alignment is adequate. (11/20/2023)      Assessment:   1. Palmoplantar keratoderma   2. Metatarsalgia of both feet      Plan:  Patient was evaluated and treated and all questions answered.  Assessment and Plan    Palmoplantar  Clinton Allen  He has a genetic condition characterized by painful keratotic lesions on his palms and soles, which have not improved with previous surgical interventions. We will prescribe clobetasol (Temovate) and salicylic acid 40%, both to be used twice daily. He should mix both creams and apply to the affected areas. We will also provide him with a written diagnosis and treatment plan  Post-Surgical Foot Pain   He reports worsening pain following previous foot surgeries, although X-rays show adequate foot alignment with no acute osteo abnormalities. We do not recommend surgical intervention at this time. He should continue with the prescribed topical treatments for palmoplantar keratoderma, which may also provide some relief for the foot pain.          No follow-ups on file.

## 2023-11-20 NOTE — Patient Instructions (Addendum)
VISIT SUMMARY:  During today's visit, we discussed your severe foot pain and recurring sores on your feet and hands. We reviewed your history of multiple foot surgeries and the various treatments you have tried without success. We also talked about your ongoing efforts to seek disability due to your inability to work.  YOUR PLAN:  -PALMOPLANTAR KERATODERMA: Palmoplantar keratosis is a genetic condition that causes painful, thickened skin lesions on the palms of your hands and soles of your feet. We will start you on clobetasol (Temovate) and salicylic acid 40%, both to be used twice daily. Mix both creams and apply them to the affected areas. We will also provide you with a written diagnosis and treatment plan to support your disability application.  -POST-SURGICAL FOOT PAIN: Post-surgical foot pain refers to the pain you are experiencing after your previous foot surgeries. Although your X-rays show that your foot alignment is adequate and there are no new bone issues, we do not recommend further surgery at this time. Continue using the prescribed topical treatments for palmoplantar keratosis, as they may also help relieve some of your foot pain.  INSTRUCTIONS:  Please follow the prescribed treatment plan and apply the mixed creams twice daily to the affected areas. We will provide you with a written diagnosis and treatment plan for your disability application. If your symptoms do not improve or worsen, please schedule a follow-up appointment.

## 2023-11-21 NOTE — Addendum Note (Signed)
Addended byLilian Kapur, Rosha Cocker R on: 11/21/2023 01:55 PM   Modules accepted: Level of Service

## 2024-04-22 ENCOUNTER — Other Ambulatory Visit (HOSPITAL_COMMUNITY): Payer: Self-pay | Admitting: Nephrology

## 2024-04-22 DIAGNOSIS — R3129 Other microscopic hematuria: Secondary | ICD-10-CM

## 2024-04-27 NOTE — Addendum Note (Signed)
 Addended byMichalene Agee, Latrish Mogel R on: 04/27/2024 05:14 PM   Modules accepted: Level of Service

## 2024-05-24 ENCOUNTER — Other Ambulatory Visit: Payer: Self-pay

## 2024-05-24 DIAGNOSIS — Z01818 Encounter for other preprocedural examination: Secondary | ICD-10-CM

## 2024-05-25 ENCOUNTER — Other Ambulatory Visit: Payer: Self-pay | Admitting: Interventional Radiology

## 2024-05-25 DIAGNOSIS — Z01818 Encounter for other preprocedural examination: Secondary | ICD-10-CM

## 2024-05-25 NOTE — Progress Notes (Incomplete)
 Chief Complaint: Patient was seen in consultation today for hematuria and proteinuria  at the request of Clementine Cutting  Referring Physician(s): Clementine Cutting  Supervising Physician: Creasie Doctor  Patient Status: [facility] - Out[facility]pt  History of Present Illness: Clinton Allen is a 53 y.o. male with PMHs of bipolar 1 disorder, vit D deficiency and hematuria and proteinuria who presents for random renal bx.   Patient was referred to nephrology by his urologist for further eval and management of hematuria and proteinuria. Imaging and serologic workup has not showed etiology of the hematuria and proteinuria, random renal bx was recommended to the patient which he decided to proceed.   ***  Past Medical History:  Diagnosis Date   Allergic rhinitis    Bipolar 1 disorder (HCC)    Bronchitis    Complication of anesthesia    allergy to eggs   Degenerative lumbar disc    Erectile dysfunction    Hammer toe    Marijuana abuse    03-2018 smokes daily   Neck fracture (HCC)    Vitamin D deficiency     Past Surgical History:  Procedure Laterality Date   Cotton Osteotomy w/ Bone Graft Left 04/27/2015   Cotton Osteotomy w/ Bone Graft Right 06/29/2015   CYST EXCISION Right 04/08/2018   Procedure: EXCISITON CYST FROM RIGHT BUTTOCKS ERAS PATHWAY;  Surgeon: Jacolyn Matar, MD;  Location: [facility];  Service: General;  Laterality: Right;   cyst removed from buttocks     04-08-18 Dr. Renaye Carp Toe Repair Left 04/27/2015   Lt #2   HERNIA REPAIR     umbilical   NECK SURGERY     TOE SURGERY Bilateral     Allergies: Egg-derived products  Medications: Prior to Admission medications   Medication Sig Start Date End Date Taking? Authorizing Provider  cetirizine  (ZYRTEC  ALLERGY) 10 MG tablet Take 1 tablet (10 mg total) by mouth daily. 06/13/22 07/13/22  Ethlyn Herd, MD  clobetasol  cream (TEMOVATE ) 0.05 % Apply 1 Application topically 2 (two) times daily. 11/20/23   McDonald,  Olive Better, DPM  Olopatadine  HCl 0.2 % SOLN Apply 1 drop to eye daily. 06/13/22   Ethlyn Herd, MD  Polyethyl Glycol-Propyl Glycol (SYSTANE) 0.4-0.3 % SOLN Apply 1 drop to eye 4 (four) times daily as needed. 06/13/22   Ethlyn Herd, MD  Salicylic Acid  40 % STRP Apply 1 Application topically daily. 11/20/23   McDonald, Adam R, DPM  Vitamin D, Ergocalciferol, (DRISDOL) 1.25 MG (50000 UNIT) CAPS capsule Take 50,000 Units by mouth once a week. 01/22/22   [provider]     Family History  Problem Relation Age of Onset   Heart attack Father    Hypertension Mother    Hyperlipidemia Mother    Hyperkalemia Mother    Lung cancer Maternal Uncle        smoker    Social History   Socioeconomic History   Marital status: Legally Separated    Spouse name: Not on file   Number of children: 8   Years of education: Not on file   Highest education level: Not on file  Occupational History   Not on file  Tobacco Use   Smoking status: Former    Current packs/day: 0.00    Types: Cigarettes    Quit date: 12/16/1993    Years since quitting: 30.4   Smokeless tobacco: Never  Vaping Use   Vaping status: Never Used  Substance and Sexual Activity   Alcohol use:  No   Drug use: Not Currently    Types: Marijuana    Comment: 03-2018 smokes daily   Sexual activity: Yes  Other Topics Concern   Not on file  Social History Narrative   Not on file   Social Drivers of Health   Financial Resource Strain: Not on file  Food Insecurity: Unknown (04/20/2024)   Received from [facility]   Hunger Vital Sign    Worried About Running Out of Food in the Last Year: Patient declined to answer    Ran Out of Food in the Last Year: Patient declined to answer  Transportation Needs: Not on file (04/20/2024)  Physical Activity: Not on file  Stress: Not on file  Social Connections: Unknown (03/12/2023)   [facility] from [facility]   Social Network    Social Network: Not on file     Review  of Systems: A 12 point ROS discussed and pertinent positives are indicated in the HPI above.  All other systems are negative.  Vital Signs: There were no vitals taken for this visit.  *** Physical Exam     Imaging: No results found.  Labs:  CBC: No results for input(s): "WBC", "HGB", "HCT", "PLT" in the last 8760 hours.  COAGS: No results for input(s): "INR", "APTT" in the last 8760 hours.  BMP: No results for input(s): "NA", "K", "CL", "CO2", "GLUCOSE", "BUN", "CALCIUM", "CREATININE", "GFRNONAA", "GFRAA" in the last 8760 hours.  Invalid input(s): "CMP"  LIVER FUNCTION TESTS: No results for input(s): "BILITOT", "AST", "ALT", "ALKPHOS", "PROT", "ALBUMIN" in the last 8760 hours.  TUMOR MARKERS: No results for input(s): "AFPTM", "CEA", "CA199", "CHROMGRNA" in the last 8760 hours.  Assessment and Plan: 53 y.o. male with hematuria and proteinuria who presents for random renal bx.   NPO since MN VSS CBC INR Not on AC/AP Allergies reviewed  Risks and benefits of random renal bx was discussed with the patient and/or patient's family including, but not limited to bleeding, infection, damage to adjacent structures or low yield requiring additional tests.  All of the questions were answered and there is agreement to proceed.  Consent signed and in chart.     Thank you for this interesting consult.  I greatly enjoyed meeting DAVELLE ANSELMI and look forward to participating in their care.  A copy of this report was sent to the requesting provider on this date.  Electronically Signed: Darel Ebbs, PA-C 05/25/2024, 7:59 PM   I spent a total of  30 Minutes   in face to face in clinical consultation, greater than 50% of which was counseling/coordinating care for random renal bx.   This chart was dictated using voice recognition software.  Despite best efforts to proofread,  errors can occur which can change the documentation meaning.

## 2024-05-26 ENCOUNTER — Other Ambulatory Visit: Payer: Self-pay

## 2024-05-26 ENCOUNTER — Ambulatory Visit (HOSPITAL_COMMUNITY)
Admission: RE | Admit: 2024-05-26 | Discharge: 2024-05-26 | Disposition: A | Discharge status: Home / Self Care | Source: Ambulatory Visit | Attending: Nephrology | Admitting: Nephrology

## 2024-05-26 ENCOUNTER — Encounter (HOSPITAL_COMMUNITY): Payer: Self-pay

## 2024-05-26 DIAGNOSIS — R319 Hematuria, unspecified: Secondary | ICD-10-CM | POA: Diagnosis present

## 2024-05-26 DIAGNOSIS — E559 Vitamin D deficiency, unspecified: Secondary | ICD-10-CM | POA: Diagnosis not present

## 2024-05-26 DIAGNOSIS — R809 Proteinuria, unspecified: Secondary | ICD-10-CM | POA: Insufficient documentation

## 2024-05-26 DIAGNOSIS — Z87891 Personal history of nicotine dependence: Secondary | ICD-10-CM | POA: Insufficient documentation

## 2024-05-26 DIAGNOSIS — R3129 Other microscopic hematuria: Secondary | ICD-10-CM

## 2024-05-26 DIAGNOSIS — I701 Atherosclerosis of renal artery: Secondary | ICD-10-CM | POA: Insufficient documentation

## 2024-05-26 DIAGNOSIS — Z01818 Encounter for other preprocedural examination: Secondary | ICD-10-CM

## 2024-05-26 LAB — CBC
HCT: 41.9 % (ref 39.0–52.0)
Hemoglobin: 13.8 g/dL (ref 13.0–17.0)
MCH: 32.2 pg (ref 26.0–34.0)
MCHC: 32.9 g/dL (ref 30.0–36.0)
MCV: 97.7 fL (ref 80.0–100.0)
Platelets: 202 10*3/uL (ref 150–400)
RBC: 4.29 MIL/uL (ref 4.22–5.81)
RDW: 12.3 % (ref 11.5–15.5)
WBC: 4 10*3/uL (ref 4.0–10.5)
nRBC: 0 % (ref 0.0–0.2)

## 2024-05-26 LAB — PROTIME-INR
INR: 1 (ref 0.8–1.2)
Prothrombin Time: 13.1 s (ref 11.4–15.2)

## 2024-05-26 MED ORDER — MIDAZOLAM HCL 2 MG/2ML IJ SOLN
INTRAMUSCULAR | Status: AC
Start: 2024-05-26 — End: 2024-05-26
  Filled 2024-05-26: qty 2

## 2024-05-26 MED ORDER — MIDAZOLAM HCL 2 MG/2ML IJ SOLN
INTRAMUSCULAR | Status: AC
  Filled 2024-05-26: qty 2

## 2024-05-26 MED ORDER — MIDAZOLAM HCL 2 MG/2ML IJ SOLN
INTRAMUSCULAR | Status: AC | PRN
  Administered 2024-05-26: .5 mg via INTRAVENOUS
  Administered 2024-05-26 (×2): 1 mg via INTRAVENOUS

## 2024-05-26 MED ORDER — FENTANYL CITRATE (PF) 100 MCG/2ML IJ SOLN
INTRAMUSCULAR | Status: AC
Start: 2024-05-26 — End: 2024-05-26
  Filled 2024-05-26: qty 2

## 2024-05-26 MED ORDER — LIDOCAINE HCL (PF) 1 % IJ SOLN
10.0000 mL | Freq: Once | INTRAMUSCULAR | Status: AC
  Administered 2024-05-26: 10 mL via INTRADERMAL

## 2024-05-26 MED ORDER — GELATIN ABSORBABLE 12-7 MM EX MISC
1.0000 | Freq: Once | CUTANEOUS | Status: AC
  Administered 2024-05-26: 1 via TOPICAL

## 2024-05-26 MED ORDER — SODIUM CHLORIDE 0.9 % IV SOLN: INTRAVENOUS | Status: DC

## 2024-05-26 MED ORDER — ACETAMINOPHEN 325 MG PO TABS: 650.0000 mg | ORAL_TABLET | Freq: Four times a day (QID) | ORAL | Status: DC | PRN

## 2024-05-26 MED ORDER — FENTANYL CITRATE (PF) 100 MCG/2ML IJ SOLN
INTRAMUSCULAR | Status: AC | PRN
  Administered 2024-05-26: 25 ug via INTRAVENOUS
  Administered 2024-05-26: 50 ug via INTRAVENOUS

## 2024-05-26 NOTE — H&P (Signed)
 Chief Complaint: Patient was seen in consultation today for hematuria and proteinuria  at the request of Clementine Cutting  Referring Physician(s): Clementine Cutting  Supervising Physician: Creasie Doctor  Patient Status: Clinton Allen - Out-pt  History of Present Illness: Clinton Allen is a 53 y.o. male with PMHs of bipolar 1 disorder, vit D deficiency and hematuria and proteinuria who presents for random renal bx.   Patient was referred to nephrology by his urologist for further eval and management of hematuria and proteinuria. Imaging and serologic workup has not showed etiology of the hematuria and proteinuria, random renal bx was recommended to the patient which he decided to proceed.   Patient laying in bed, not in acute distress.  Denise headache, fever, chills, shortness of breath, cough, chest pain, abdominal pain, nausea ,vomiting, and bleeding.   Past Medical History:  Diagnosis Date   Allergic rhinitis    Bipolar 1 disorder (HCC)    Bronchitis    Complication of anesthesia    allergy to eggs   Degenerative lumbar disc    Erectile dysfunction    Hammer toe    Marijuana abuse    03-2018 smokes daily   Neck fracture (HCC)    Vitamin D deficiency     Past Surgical History:  Procedure Laterality Date   Cotton Osteotomy w/ Bone Graft Left 04/27/2015   Cotton Osteotomy w/ Bone Graft Right 06/29/2015   CYST EXCISION Right 04/08/2018   Procedure: EXCISITON CYST FROM RIGHT BUTTOCKS ERAS PATHWAY;  Surgeon: Jacolyn Matar, MD;  Location: [facility];  Service: General;  Laterality: Right;   cyst removed from buttocks     04-08-18 Dr. Renaye Carp Toe Repair Left 04/27/2015   Lt #2   HERNIA REPAIR     umbilical   NECK SURGERY     TOE SURGERY Bilateral     Allergies: Egg-derived products  Medications: Prior to Admission medications   Medication Sig Start Date End Date Taking? Authorizing Provider  cetirizine  (ZYRTEC  ALLERGY) 10 MG tablet Take 1 tablet (10 mg  total) by mouth daily. 06/13/22 07/13/22  Ethlyn Herd, MD  clobetasol  cream (TEMOVATE ) 0.05 % Apply 1 Application topically 2 (two) times daily. 11/20/23   McDonald, Olive Better, DPM  Olopatadine  HCl 0.2 % SOLN Apply 1 drop to eye daily. 06/13/22   Ethlyn Herd, MD  Polyethyl Glycol-Propyl Glycol (SYSTANE) 0.4-0.3 % SOLN Apply 1 drop to eye 4 (four) times daily as needed. 06/13/22   Ethlyn Herd, MD  Salicylic Acid  40 % STRP Apply 1 Application topically daily. 11/20/23   McDonald, Adam R, DPM  Vitamin D, Ergocalciferol, (DRISDOL) 1.25 MG (50000 UNIT) CAPS capsule Take 50,000 Units by mouth once a week. 01/22/22   [provider]     Family History  Problem Relation Age of Onset   Heart attack Father    Hypertension Mother    Hyperlipidemia Mother    Hyperkalemia Mother    Lung cancer Maternal Uncle        smoker    Social History   Socioeconomic History   Marital status: Legally Separated    Spouse name: Not on file   Number of children: 8   Years of education: Not on file   Highest education level: Not on file  Occupational History   Not on file  Tobacco Use   Smoking status: Former    Current packs/day: 0.00    Types: Cigarettes    Quit date: 12/16/1993    Years since  quitting: 30.4   Smokeless tobacco: Never  Vaping Use   Vaping status: Never Used  Substance and Sexual Activity   Alcohol use: No   Drug use: Not Currently    Types: Marijuana    Comment: 03-2018 smokes daily   Sexual activity: Yes  Other Topics Concern   Not on file  Social History Narrative   Not on file   Social Drivers of Health   Financial Resource Strain: Not on file  Food Insecurity: Unknown (04/20/2024)   Received from [facility]   Hunger Vital Sign    Worried About Running Out of Food in the Last Year: Patient declined to answer    Ran Out of Food in the Last Year: Patient declined to answer  Transportation Needs: Not on file (04/20/2024)  Physical Activity: Not on file   Stress: Not on file  Social Connections: Unknown (03/12/2023)   Received from [facility]   Social Network    Social Network: Not on file     Review of Systems: A 12 point ROS discussed and pertinent positives are indicated in the HPI above.  All other systems are negative.  Vital Signs: BP 116/76   Pulse (!) 43   Temp 98.1 F (36.7 C) (Oral)   Resp 15   Ht 5' 10 (1.778 m)   Wt 187 lb (84.8 kg)   SpO2 99%   BMI 26.83 kg/m    Physical Exam Vitals and nursing note reviewed.  Constitutional:      General: Patient is not in acute distress.    Appearance: Normal appearance. Patient is not ill-appearing.  HENT:     Head: Normocephalic and atraumatic.     Mouth/Throat:     Mouth: Mucous membranes are moist.     Pharynx: Oropharynx is clear.  Cardiovascular:     Rate and Rhythm: Normal rate and regular rhythm.     Pulses: Normal pulses.     Heart sounds: Normal heart sounds.  Pulmonary:     Effort: Pulmonary effort is normal.     Breath sounds: Normal breath sounds.  Abdominal:     General: Abdomen is flat. Bowel sounds are normal.     Palpations: Abdomen is soft.  Musculoskeletal:     Cervical back: Neck supple.  Skin:    General: Skin is warm and dry.     Coloration: Skin is not jaundiced or pale.  Neurological:     Mental Status: Patient is alert and oriented to person, place, and time.  Psychiatric:        Mood and Affect: Mood normal.        Behavior: Behavior normal.        Judgment: Judgment normal.    MD Evaluation Airway: WNL Heart: WNL Abdomen: WNL Chest/ Lungs: WNL ASA  Classification: 2 Mallampati/Airway Score: Two  Imaging: No results found.  Labs:  CBC: Recent Labs    05/26/24 0642  WBC 4.0  HGB 13.8  HCT 41.9  PLT 202    COAGS: Recent Labs    05/26/24 0642  INR 1.0    BMP: No results for input(s): NA, K, CL, CO2, GLUCOSE, BUN, CALCIUM, CREATININE, GFRNONAA, GFRAA in the last 8760  hours.  Invalid input(s): CMP  LIVER FUNCTION TESTS: No results for input(s): BILITOT, AST, ALT, ALKPHOS, PROT, ALBUMIN in the last 8760 hours.  TUMOR MARKERS: No results for input(s): AFPTM, CEA, CA199, CHROMGRNA in the last 8760 hours.  Assessment and Plan: 53 y.o. male  with hematuria and proteinuria who presents for random renal bx.   NPO since MN VSS CBC wnl INR 1.0 Not on AC/AP Allergies reviewed  Risks and benefits of random renal bx was discussed with the patient and/or patient's family including, but not limited to bleeding, infection, damage to adjacent structures or low yield requiring additional tests.  All of the questions were answered and there is agreement to proceed.  Consent signed and in chart.     Thank you for this interesting consult.  I greatly enjoyed meeting YANIEL LIMBAUGH and look forward to participating in their care.  A copy of this report was sent to the requesting provider on this date.  Electronically Signed: Darel Ebbs, PA-C 05/26/2024, 7:32 AM   I spent a total of  30 Minutes   in face to face in clinical consultation, greater than 50% of which was counseling/coordinating care for random renal bx.   This chart was dictated using voice recognition software.  Despite best efforts to proofread,  errors can occur which can change the documentation meaning.

## 2024-05-26 NOTE — Discharge Instructions (Signed)
 Drink plenty of fluids over the next 2-3 days.  Needle Biopsy, Care After These instructions tell you how to care for yourself after your procedure. Your doctor may give you more instructions. Call your doctor if you have any problems or questions. What can I expect after the procedure? After a needle biopsy, it is common to have these things at the puncture site: Soreness. Bruising. Mild pain. These things should go away after a few days. Follow these instructions at home: Puncture site care  Wash your hands with soap and water for at least 20 seconds before and after you change your bandage (dressing). If you cannot use soap and water, use hand sanitizer. Follow instructions from your doctor about how to take care of your puncture site. This includes: Remove Dressing in 24 hours.  You May Shower In 24 Hours. DO NOT SCRUB over site.  Check your puncture site every day for signs of infection. Check for: Redness, swelling, or more pain. Fluid or blood. Warmth. Pus or a bad smell. General instructions Go back to your normal activities when your doctor says that it is safe. Ask if there is anything you cannot do as you heal. Take over-the-counter and prescription medicines only as told by your doctor. Do not take baths, swim, or use a hot tub for at least 5 days.  Keep all follow-up visits. Ask if you need an appointment to get your biopsy results. Contact a doctor if: You have a fever. You have redness, swelling, or more pain at the puncture site, and it lasts longer than a few days. You have fluid, blood, or pus coming from the puncture site. Your puncture site feels warm. Get help right away if: You have very bad bleeding from the puncture site. Summary After the procedure, it is common to have soreness, bruising, or mild pain at the puncture site. Check your puncture site every day for signs of infection, such as redness, swelling, or more pain. Get help right away if you have  very bad bleeding from your puncture site. This information is not intended to replace advice given to you by your health care provider. Make sure you discuss any questions you have with your health care provider. Document Revised: 05/23/2021 Document Reviewed: 05/23/2021 Elsevier Patient Education  2024 [employer].

## 2024-06-08 ENCOUNTER — Encounter (HOSPITAL_COMMUNITY): Payer: Self-pay

## 2024-06-08 LAB — SURGICAL PATHOLOGY
